# Patient Record
Sex: Male | Born: 1965 | Race: Black or African American | Hispanic: No | Marital: Married | Smoking: Never smoker
Health system: Southern US, Community
[De-identification: ages and names within clinical notes are randomized; demographics above are authoritative.]

## PROBLEM LIST (undated history)

## (undated) DIAGNOSIS — R7303 Prediabetes: Secondary | ICD-10-CM

## (undated) DIAGNOSIS — E785 Hyperlipidemia, unspecified: Secondary | ICD-10-CM

## (undated) DIAGNOSIS — I10 Essential (primary) hypertension: Secondary | ICD-10-CM

## (undated) DIAGNOSIS — G4730 Sleep apnea, unspecified: Secondary | ICD-10-CM

## (undated) DIAGNOSIS — J189 Pneumonia, unspecified organism: Secondary | ICD-10-CM

## (undated) DIAGNOSIS — J309 Allergic rhinitis, unspecified: Secondary | ICD-10-CM

## (undated) DIAGNOSIS — F419 Anxiety disorder, unspecified: Secondary | ICD-10-CM

## (undated) HISTORY — PX: SINOSCOPY: SHX187

## (undated) HISTORY — DX: Allergic rhinitis, unspecified: J30.9

---

## 2016-04-21 ENCOUNTER — Emergency Department (HOSPITAL_COMMUNITY): Payer: Federal, State, Local not specified - PPO

## 2016-04-21 ENCOUNTER — Other Ambulatory Visit: Payer: Self-pay

## 2016-04-21 ENCOUNTER — Emergency Department (HOSPITAL_COMMUNITY)
Admission: EM | Admit: 2016-04-21 | Discharge: 2016-04-21 | Disposition: A | Discharge status: Home / Self Care | Payer: Federal, State, Local not specified - PPO | Attending: Emergency Medicine | Admitting: Emergency Medicine

## 2016-04-21 ENCOUNTER — Encounter (HOSPITAL_COMMUNITY): Payer: Self-pay | Admitting: Nurse Practitioner

## 2016-04-21 DIAGNOSIS — F439 Reaction to severe stress, unspecified: Secondary | ICD-10-CM | POA: Diagnosis not present

## 2016-04-21 DIAGNOSIS — N289 Disorder of kidney and ureter, unspecified: Secondary | ICD-10-CM

## 2016-04-21 DIAGNOSIS — R791 Abnormal coagulation profile: Secondary | ICD-10-CM | POA: Diagnosis not present

## 2016-04-21 DIAGNOSIS — I10 Essential (primary) hypertension: Secondary | ICD-10-CM | POA: Diagnosis not present

## 2016-04-21 DIAGNOSIS — R42 Dizziness and giddiness: Secondary | ICD-10-CM

## 2016-04-21 DIAGNOSIS — F419 Anxiety disorder, unspecified: Secondary | ICD-10-CM | POA: Diagnosis not present

## 2016-04-21 HISTORY — DX: Essential (primary) hypertension: I10

## 2016-04-21 LAB — APTT: APTT: 26 s (ref 24–37)

## 2016-04-21 LAB — CBG MONITORING, ED: Glucose-Capillary: 172 mg/dL — ABNORMAL HIGH (ref 65–99)

## 2016-04-21 LAB — CBC
HEMATOCRIT: 49.9 % (ref 39.0–52.0)
Hemoglobin: 16.4 g/dL (ref 13.0–17.0)
MCH: 27.1 pg (ref 26.0–34.0)
MCHC: 32.9 g/dL (ref 30.0–36.0)
MCV: 82.5 fL (ref 78.0–100.0)
PLATELETS: 248 10*3/uL (ref 150–400)
RBC: 6.05 MIL/uL — ABNORMAL HIGH (ref 4.22–5.81)
RDW: 13.1 % (ref 11.5–15.5)
WBC: 9.3 10*3/uL (ref 4.0–10.5)

## 2016-04-21 LAB — I-STAT CHEM 8, ED
BUN: 14 mg/dL (ref 6–20)
CREATININE: 1.4 mg/dL — AB (ref 0.61–1.24)
Calcium, Ion: 1.14 mmol/L (ref 1.13–1.30)
Chloride: 102 mmol/L (ref 101–111)
Glucose, Bld: 149 mg/dL — ABNORMAL HIGH (ref 65–99)
HEMATOCRIT: 53 % — AB (ref 39.0–52.0)
HEMOGLOBIN: 18 g/dL — AB (ref 13.0–17.0)
POTASSIUM: 4 mmol/L (ref 3.5–5.1)
SODIUM: 139 mmol/L (ref 135–145)
TCO2: 25 mmol/L (ref 0–100)

## 2016-04-21 LAB — COMPREHENSIVE METABOLIC PANEL
ALT: 24 U/L (ref 17–63)
AST: 28 U/L (ref 15–41)
Albumin: 4.4 g/dL (ref 3.5–5.0)
Alkaline Phosphatase: 50 U/L (ref 38–126)
Anion gap: 10 (ref 5–15)
BUN: 11 mg/dL (ref 6–20)
CHLORIDE: 104 mmol/L (ref 101–111)
CO2: 22 mmol/L (ref 22–32)
CREATININE: 1.66 mg/dL — AB (ref 0.61–1.24)
Calcium: 9.6 mg/dL (ref 8.9–10.3)
GFR calc Af Amer: 55 mL/min — ABNORMAL LOW (ref >60)
GFR calc non Af Amer: 47 mL/min — ABNORMAL LOW (ref >60)
Glucose, Bld: 152 mg/dL — ABNORMAL HIGH (ref 65–99)
POTASSIUM: 3.9 mmol/L (ref 3.5–5.1)
SODIUM: 136 mmol/L (ref 135–145)
Total Bilirubin: 0.9 mg/dL (ref 0.3–1.2)
Total Protein: 7.8 g/dL (ref 6.5–8.1)

## 2016-04-21 LAB — DIFFERENTIAL
BASOS ABS: 0 10*3/uL (ref 0.0–0.1)
BASOS PCT: 0 %
EOS ABS: 0.1 10*3/uL (ref 0.0–0.7)
Eosinophils Relative: 1 %
Lymphocytes Relative: 20 %
Lymphs Abs: 1.8 10*3/uL (ref 0.7–4.0)
Monocytes Absolute: 0.3 10*3/uL (ref 0.1–1.0)
Monocytes Relative: 3 %
NEUTROS ABS: 7 10*3/uL (ref 1.7–7.7)
NEUTROS PCT: 76 %

## 2016-04-21 LAB — I-STAT TROPONIN, ED: TROPONIN I, POC: 0 ng/mL (ref 0.00–0.08)

## 2016-04-21 LAB — PROTIME-INR
INR: 1.14 (ref 0.00–1.49)
PROTHROMBIN TIME: 14.8 s (ref 11.6–15.2)

## 2016-04-21 MED ORDER — ZOLPIDEM TARTRATE 5 MG PO TABS: 5.0000 mg | ORAL_TABLET | Freq: Every evening | ORAL | Status: DC | PRN

## 2016-04-21 MED ORDER — SERTRALINE HCL 25 MG PO TABS: 25.0000 mg | ORAL_TABLET | Freq: Every day | ORAL | Status: DC

## 2016-04-21 NOTE — ED Notes (Signed)
He c/o onset dizziness and posterior head pressure while sitting at work today. He reports several of these "dizzy spells" recently which he has been evaluated for with no diagnosis. He reports nausea, blurred vision, and feeling weak with the dizzy spells. He reports symptoms have improved since onset today but does remain mildly dizzy especially with head movements. He is alert and breathing easily.

## 2016-04-21 NOTE — ED Notes (Signed)
Pt. Transported to CT at this time.  

## 2016-04-21 NOTE — ED Provider Notes (Signed)
CSN: [id number]     Arrival date & time 04/21/16  1113 History   First MD Initiated Contact with Patient 04/21/16 1129     Chief Complaint  Patient presents with  . Dizziness    HPI Patient presents to the emergency room for evaluation of an episode of dizziness and head pressure while at work today. Patient states he started having an episode of nausea, he felt like he was lightheaded and might pass out. He felt like it was difficult to concentrate and his speech may have been affected. He felt some tingling on the right side. He denies any focal weakness. He called his wife. She brought him to the hospital for evaluation. He drank a smoothie on the way over and feels a little bit better. Patient has been having some trouble with fatigue over the last few weeks. He feels like his exercise tolerance is decreased. Patient also feels like his penis is retracted. He denies any trouble with chest pain. He is not having any shortness of breath. He denies any leg swelling. No vomiting or diarrhea. No fever. Past Medical History  Diagnosis Date  . Hypertension    History reviewed. No pertinent past surgical history. History reviewed. No pertinent family history. Social History  Substance Use Topics  . Smoking status: Never Smoker   . Smokeless tobacco: None  . Alcohol Use: Yes    Review of Systems  All other systems reviewed and are negative.     Allergies  Review of patient's allergies indicates no known allergies.  Home Medications   Prior to Admission medications   Medication Sig Start Date End Date Taking? Authorizing Provider  amoxicillin-clavulanate (AUGMENTIN) 875-125 MG tablet Take 1 tablet by mouth 2 (two) times daily. 04/08/16  Yes Historical Provider, MD  fexofenadine-pseudoephedrine (ALLEGRA-D) 60-120 MG 12 hr tablet Take 1 tablet by mouth 2 (two) times daily as needed (allergies/congestion).   Yes Historical Provider, MD  Flaxseed, Linseed, (FLAX SEED OIL PO) Take 1 tablet  by mouth daily.   Yes Historical Provider, MD  Multiple Vitamins-Minerals (MULTIVITAMIN PO) Take 1 tablet by mouth daily.   Yes Historical Provider, MD  Omega-3 Fatty Acids (OMEGA 3 PO) Take 1 tablet by mouth daily.   Yes Historical Provider, MD  sertraline (ZOLOFT) 25 MG tablet Take 1 tablet (25 mg total) by mouth daily. 04/21/16   Linwood DibblesJon Lonita Debes, MD  zolpidem (AMBIEN) 5 MG tablet Take 1 tablet (5 mg total) by mouth at bedtime as needed for sleep. 04/21/16   Linwood DibblesJon Seth Higginbotham, MD   BP 119/77 mmHg  Pulse 86  Temp(Src) 98.3 F (36.8 C) (Oral)  Resp 15  Ht 6\' 1"  (1.854 m)  Wt 102.558 kg  BMI 29.84 kg/m2  SpO2 99% Physical Exam  Constitutional: He is oriented to person, place, and time. He appears well-developed and well-nourished. No distress.  HENT:  Head: Normocephalic and atraumatic.  Right Ear: External ear normal.  Left Ear: External ear normal.  Mouth/Throat: Oropharynx is clear and moist.  Eyes: Conjunctivae are normal. Right eye exhibits no discharge. Left eye exhibits no discharge. No scleral icterus.  Neck: Neck supple. No tracheal deviation present.  Cardiovascular: Normal rate, regular rhythm and intact distal pulses.   Pulmonary/Chest: Effort normal and breath sounds normal. No stridor. No respiratory distress. He has no wheezes. He has no rales.  Abdominal: Soft. Bowel sounds are normal. He exhibits no distension. There is no tenderness. There is no rebound and no guarding.  Musculoskeletal: He exhibits no  edema or tenderness.  Neurological: He is alert and oriented to person, place, and time. He has normal strength. No cranial nerve deficit (No facial droop, extraocular movements intact, tongue midline ) or sensory deficit. He exhibits normal muscle tone. He displays no seizure activity. Coordination normal.  No pronator drift bilateral upper extrem, able to hold both legs off bed for 5 seconds, sensation intact in all extremities, no visual field cuts, no left or right sided neglect,  normal finger-nose exam bilaterally, no nystagmus noted   Skin: Skin is warm and dry. No rash noted.  Psychiatric: He has a normal mood and affect.  Nursing note and vitals reviewed.   ED Course  Procedures (including critical care time) Labs Review Labs Reviewed  CBC - Abnormal; Notable for the following:    RBC 6.05 (*)    All other components within normal limits  COMPREHENSIVE METABOLIC PANEL - Abnormal; Notable for the following:    Glucose, Bld 152 (*)    Creatinine, Ser 1.66 (*)    GFR calc non Af Amer 47 (*)    GFR calc Af Amer 55 (*)    All other components within normal limits  CBG MONITORING, ED - Abnormal; Notable for the following:    Glucose-Capillary 172 (*)    All other components within normal limits  I-STAT CHEM 8, ED - Abnormal; Notable for the following:    Creatinine, Ser 1.40 (*)    Glucose, Bld 149 (*)    Hemoglobin 18.0 (*)    HCT 53.0 (*)    All other components within normal limits  PROTIME-INR  APTT  DIFFERENTIAL  I-STAT TROPOININ, ED  I-STAT TROPOININ, ED    Imaging Review Ct Head Wo Contrast  04/21/2016  CLINICAL DATA:  Progressive dizziness for several weeks. Hypertension. EXAM: CT HEAD WITHOUT CONTRAST TECHNIQUE: Contiguous axial images were obtained from the base of the skull through the vertex without intravenous contrast. COMPARISON:  None. FINDINGS: No evidence of intracranial hemorrhage, brain edema, or other signs of acute infarction. No evidence of intracranial mass lesion or mass effect. No abnormal extraaxial fluid collections identified. Ventricles are normal in size. No skull abnormality identified. IMPRESSION: Negative noncontrast head CT. Electronically Signed   By: Myles Rosenthal M.D.   On: 04/21/2016 12:19   I have personally reviewed and evaluated these images and lab results as part of my medical decision-making.   EKG Interpretation   Date/Time:  Tuesday April 21 2016 11:20:54 EDT Ventricular Rate:  103 PR Interval:  138 QRS  Duration: 80 QT Interval:  312 QTC Calculation: 408 R Axis:   116 Text Interpretation:  Sinus tachycardia Right axis deviation Pulmonary  disease pattern Abnormal ECG No old tracing to compare Confirmed by Mateusz Neilan   MD-J, Jaxton Casale ([id number]) on 04/21/2016 12:46:40 PM      MDM   Final diagnoses:  Dizziness  Stress  Renal insufficiency  Anxiety   The patient presented to the emergency room with various complaints. The included an episode of dizziness today associated with nausea. Patient also has noticed decreased exercise tolerance recently. Discussed the test results with the patient and his family. Doubt TIA, stroke, ACS based on sx.The patient and wife stated they think a lot of his symptoms may be related to stress and anxiety. Over the last year the patient's had several friends passed away. This  understandably has concerned him.  Patient has not been sleeping well for several months. He wakes up at night feeling stressed and anxious. Patient  is planning on seeing his primary doctor but the family requested medication for anxiety. I will start him on Zoloft and prescription for Ambien, 3 tablets.    Linwood DibblesJon Chanan Detwiler, MD 04/21/16 [phone]

## 2016-04-21 NOTE — ED Notes (Signed)
CBG 172 

## 2016-04-21 NOTE — ED Notes (Signed)
EDP at bedside  

## 2016-06-30 DIAGNOSIS — G4700 Insomnia, unspecified: Secondary | ICD-10-CM | POA: Diagnosis not present

## 2016-07-17 DIAGNOSIS — M25512 Pain in left shoulder: Secondary | ICD-10-CM | POA: Diagnosis not present

## 2016-07-17 DIAGNOSIS — F419 Anxiety disorder, unspecified: Secondary | ICD-10-CM | POA: Diagnosis not present

## 2016-07-26 HISTORY — PX: SEPTOPLASTY: SUR1290

## 2016-08-13 DIAGNOSIS — J322 Chronic ethmoidal sinusitis: Secondary | ICD-10-CM | POA: Diagnosis not present

## 2016-08-13 DIAGNOSIS — J342 Deviated nasal septum: Secondary | ICD-10-CM | POA: Diagnosis not present

## 2016-08-13 DIAGNOSIS — J320 Chronic maxillary sinusitis: Secondary | ICD-10-CM | POA: Diagnosis not present

## 2016-08-13 DIAGNOSIS — J328 Other chronic sinusitis: Secondary | ICD-10-CM | POA: Diagnosis not present

## 2016-08-13 DIAGNOSIS — J3489 Other specified disorders of nose and nasal sinuses: Secondary | ICD-10-CM | POA: Diagnosis not present

## 2016-08-13 DIAGNOSIS — J343 Hypertrophy of nasal turbinates: Secondary | ICD-10-CM | POA: Diagnosis not present

## 2016-08-13 DIAGNOSIS — J329 Chronic sinusitis, unspecified: Secondary | ICD-10-CM | POA: Diagnosis not present

## 2016-08-20 DIAGNOSIS — J322 Chronic ethmoidal sinusitis: Secondary | ICD-10-CM | POA: Diagnosis not present

## 2016-08-20 DIAGNOSIS — J320 Chronic maxillary sinusitis: Secondary | ICD-10-CM | POA: Diagnosis not present

## 2016-08-27 DIAGNOSIS — J322 Chronic ethmoidal sinusitis: Secondary | ICD-10-CM | POA: Diagnosis not present

## 2016-08-27 DIAGNOSIS — J320 Chronic maxillary sinusitis: Secondary | ICD-10-CM | POA: Diagnosis not present

## 2016-10-15 DIAGNOSIS — K080 Exfoliation of teeth due to systemic causes: Secondary | ICD-10-CM | POA: Diagnosis not present

## 2016-10-16 DIAGNOSIS — K080 Exfoliation of teeth due to systemic causes: Secondary | ICD-10-CM | POA: Diagnosis not present

## 2016-10-28 DIAGNOSIS — K080 Exfoliation of teeth due to systemic causes: Secondary | ICD-10-CM | POA: Diagnosis not present

## 2016-11-01 DIAGNOSIS — J019 Acute sinusitis, unspecified: Secondary | ICD-10-CM | POA: Diagnosis not present

## 2016-11-24 DIAGNOSIS — J320 Chronic maxillary sinusitis: Secondary | ICD-10-CM | POA: Diagnosis not present

## 2016-11-24 DIAGNOSIS — J322 Chronic ethmoidal sinusitis: Secondary | ICD-10-CM | POA: Diagnosis not present

## 2016-12-09 DIAGNOSIS — J069 Acute upper respiratory infection, unspecified: Secondary | ICD-10-CM | POA: Diagnosis not present

## 2016-12-09 DIAGNOSIS — J029 Acute pharyngitis, unspecified: Secondary | ICD-10-CM | POA: Diagnosis not present

## 2017-01-17 DIAGNOSIS — Z125 Encounter for screening for malignant neoplasm of prostate: Secondary | ICD-10-CM | POA: Diagnosis not present

## 2017-01-17 DIAGNOSIS — Z0000 Encounter for general adult medical examination without abnormal findings: Secondary | ICD-10-CM | POA: Diagnosis not present

## 2017-03-18 DIAGNOSIS — G4700 Insomnia, unspecified: Secondary | ICD-10-CM | POA: Diagnosis not present

## 2017-03-18 DIAGNOSIS — G4719 Other hypersomnia: Secondary | ICD-10-CM | POA: Diagnosis not present

## 2017-03-29 DIAGNOSIS — G4733 Obstructive sleep apnea (adult) (pediatric): Secondary | ICD-10-CM | POA: Diagnosis not present

## 2017-04-03 DIAGNOSIS — G4733 Obstructive sleep apnea (adult) (pediatric): Secondary | ICD-10-CM | POA: Diagnosis not present

## 2017-05-03 DIAGNOSIS — G4733 Obstructive sleep apnea (adult) (pediatric): Secondary | ICD-10-CM | POA: Diagnosis not present

## 2017-05-26 DIAGNOSIS — F419 Anxiety disorder, unspecified: Secondary | ICD-10-CM | POA: Diagnosis not present

## 2017-06-03 DIAGNOSIS — G4733 Obstructive sleep apnea (adult) (pediatric): Secondary | ICD-10-CM | POA: Diagnosis not present

## 2017-07-02 DIAGNOSIS — G4733 Obstructive sleep apnea (adult) (pediatric): Secondary | ICD-10-CM | POA: Diagnosis not present

## 2017-07-04 DIAGNOSIS — G4733 Obstructive sleep apnea (adult) (pediatric): Secondary | ICD-10-CM | POA: Diagnosis not present

## 2017-07-07 DIAGNOSIS — B349 Viral infection, unspecified: Secondary | ICD-10-CM | POA: Diagnosis not present

## 2017-07-08 DIAGNOSIS — J0100 Acute maxillary sinusitis, unspecified: Secondary | ICD-10-CM | POA: Diagnosis not present

## 2017-08-16 DIAGNOSIS — G4733 Obstructive sleep apnea (adult) (pediatric): Secondary | ICD-10-CM | POA: Diagnosis not present

## 2017-08-19 DIAGNOSIS — K080 Exfoliation of teeth due to systemic causes: Secondary | ICD-10-CM | POA: Diagnosis not present

## 2017-08-20 DIAGNOSIS — K080 Exfoliation of teeth due to systemic causes: Secondary | ICD-10-CM | POA: Diagnosis not present

## 2017-08-23 DIAGNOSIS — J0100 Acute maxillary sinusitis, unspecified: Secondary | ICD-10-CM | POA: Diagnosis not present

## 2017-08-24 DIAGNOSIS — K080 Exfoliation of teeth due to systemic causes: Secondary | ICD-10-CM | POA: Diagnosis not present

## 2017-09-01 DIAGNOSIS — Z0000 Encounter for general adult medical examination without abnormal findings: Secondary | ICD-10-CM | POA: Diagnosis not present

## 2017-09-01 DIAGNOSIS — Z23 Encounter for immunization: Secondary | ICD-10-CM | POA: Diagnosis not present

## 2017-09-02 DIAGNOSIS — Z13220 Encounter for screening for lipoid disorders: Secondary | ICD-10-CM | POA: Diagnosis not present

## 2017-09-02 DIAGNOSIS — Z0000 Encounter for general adult medical examination without abnormal findings: Secondary | ICD-10-CM | POA: Diagnosis not present

## 2017-09-02 DIAGNOSIS — Z131 Encounter for screening for diabetes mellitus: Secondary | ICD-10-CM | POA: Diagnosis not present

## 2017-11-03 DIAGNOSIS — H6121 Impacted cerumen, right ear: Secondary | ICD-10-CM | POA: Diagnosis not present

## 2017-11-03 DIAGNOSIS — J3081 Allergic rhinitis due to animal (cat) (dog) hair and dander: Secondary | ICD-10-CM | POA: Diagnosis not present

## 2017-11-03 DIAGNOSIS — J301 Allergic rhinitis due to pollen: Secondary | ICD-10-CM | POA: Diagnosis not present

## 2017-12-07 ENCOUNTER — Ambulatory Visit: Payer: Federal, State, Local not specified - PPO | Admitting: Allergy and Immunology

## 2017-12-07 ENCOUNTER — Encounter: Payer: Self-pay | Admitting: Allergy and Immunology

## 2017-12-07 VITALS — BP 120/76 | HR 74 | Temp 97.9°F | Resp 16 | Ht 74.0 in | Wt 255.8 lb

## 2017-12-07 DIAGNOSIS — R519 Headache, unspecified: Secondary | ICD-10-CM

## 2017-12-07 DIAGNOSIS — R51 Headache: Secondary | ICD-10-CM | POA: Diagnosis not present

## 2017-12-07 DIAGNOSIS — H1010 Acute atopic conjunctivitis, unspecified eye: Secondary | ICD-10-CM

## 2017-12-07 DIAGNOSIS — J3089 Other allergic rhinitis: Secondary | ICD-10-CM

## 2017-12-07 MED ORDER — OLOPATADINE HCL 0.7 % OP SOLN: 1.0000 [drp] | OPHTHALMIC | 5 refills | Status: DC

## 2017-12-07 MED ORDER — AZELASTINE-FLUTICASONE 137-50 MCG/ACT NA SUSP: 1.0000 | Freq: Two times a day (BID) | NASAL | 5 refills | Status: DC

## 2017-12-07 MED ORDER — MONTELUKAST SODIUM 10 MG PO TABS: 10.0000 mg | ORAL_TABLET | Freq: Every day | ORAL | 5 refills | Status: DC

## 2017-12-07 NOTE — Progress Notes (Signed)
NEW PATIENT NOTE  Referring Provider: No ref. provider found Primary Provider: Sigmund Hazel, MD Date of office visit: 12/07/2017    Subjective:   Chief Complaint:  Cody Wade (DOB: 26-Mar-1966) is a 52 y.o. male who presents to the clinic on 12/07/2017 with a chief complaint of Allergies; Sinus Problem; and Eye Problem .  HPI: Cody Wade presents to this clinic in evaluation of allergies.  He has a long history starting in his 72s of nasal congestion and stuffiness and runny nose and sneeze and runny eyes occurring on a perennial basis without an obvious trigger.  He has been worse in the past 2-1/2 years since moving from [place] to [place].  He has apparently been skin tested twice and found to be allergic to just dust mite.  His last skin testing was 7 years ago in [place].  He also has headaches located in the back of his head that oscillate in and out and are not associated with any scotoma or neurological symptoms.  Usually he can function without much problem with these headaches but occasionally he will try to lay down for relief.  They occur with a frequency of about 1 time per week.  He does drink caffeine a few times per week and rarely has chocolate and has ethanol on the weekend.  He has sleep apnea treated successfully with a CPAP machine.  Congestion at night while using a CPAP machine is not a particularly big issue.  He does not have any associated anosmia or decreased ability to taste or ugly nasal discharge or the need to use antibiotics for recurrent infections.  He does have some intermittent earaches affecting the right side more than the left side but no hearing loss or tinnitus or vertigo.  He has tried a multitude of different medications to treat this issue including a nasal steroid and antihistamines and Sudafed.  Past Medical History:  Diagnosis Date  . Hypertension     Past Surgical History:  Procedure Laterality Date  . SEPTOPLASTY Bilateral  07/2016  . SINOSCOPY      Allergies as of 12/07/2017   No Known Allergies     Medication List      fexofenadine-pseudoephedrine 60-120 MG 12 hr tablet Commonly known as:  ALLEGRA-D Take 1 tablet by mouth 2 (two) times daily as needed (allergies/congestion).   FLAX SEED OIL PO Take 1 tablet by mouth daily.   mometasone 50 MCG/ACT nasal spray Commonly known as:  NASONEX Place 1 spray into the nose daily.   MULTIVITAMIN PO Take 1 tablet by mouth daily.   OMEGA 3 PO Take 1 tablet by mouth daily.   sertraline 25 MG tablet Commonly known as:  ZOLOFT Take 1 tablet (25 mg total) by mouth daily.       Review of systems negative except as noted in HPI / PMHx or noted below:  Review of Systems  Constitutional: Negative.   HENT: Negative.   Eyes: Negative.   Respiratory: Negative.   Cardiovascular: Negative.   Gastrointestinal: Negative.   Genitourinary: Negative.   Musculoskeletal: Negative.   Skin: Negative.   Neurological: Negative.   Endo/Heme/Allergies: Negative.   Psychiatric/Behavioral: Negative.     Family History  Problem Relation Age of Onset  . Hypertension Mother   . Diabetes Father   . Cancer Father   . Cancer Maternal Grandmother     Social History   Socioeconomic History  . Marital status: Married    Spouse name: Not  on file  . Number of children: Not on file  . Years of education: Not on file  . Highest education level: Not on file  Social Needs  . Financial resource strain: Not on file  . Food insecurity - worry: Not on file  . Food insecurity - inability: Not on file  . Transportation needs - medical: Not on file  . Transportation needs - non-medical: Not on file  Occupational History  . Not on file  Tobacco Use  . Smoking status: Never Smoker  . Smokeless tobacco: Never Used  Substance and Sexual Activity  . Alcohol use: Yes    Comment: occassional  . Drug use: No  . Sexual activity: Not on file  Other Topics Concern  . Not on  file  Social History Narrative  . Not on file    Environmental and Social history  Lives in a house with a dry environment, no animals located inside the household, carpet in the bedroom, no plastic on the bed, no plastic on the pillow, and no smoking ongoing with inside the household.  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  HENT:  Head: Normocephalic.  Right Ear: Tympanic membrane, external ear and ear canal normal.  Left Ear: Tympanic membrane, external ear and ear canal normal.  Nose: Mucosal edema present. No rhinorrhea.  Mouth/Throat: Uvula is midline, oropharynx is clear and moist and mucous membranes are normal. No oropharyngeal exudate.  Eyes: Conjunctivae are normal.  Neck: Trachea normal. No tracheal tenderness present. No tracheal deviation present. No thyromegaly present.  Cardiovascular: Normal rate, regular rhythm, S1 normal, S2 normal and normal heart sounds.  No murmur heard. Pulmonary/Chest: Breath sounds normal. No stridor. No respiratory distress. He has no wheezes. He has no rales.  Musculoskeletal: He exhibits no edema.  Lymphadenopathy:       Head (right side): No tonsillar adenopathy present.       Head (left side): No tonsillar adenopathy present.    He has no cervical adenopathy.  Neurological: He is alert. Gait normal.  Skin: No rash noted. He is not diaphoretic. No erythema. Nails show no clubbing.  Psychiatric: Mood and affect normal.    Diagnostics: Allergy skin tests were performed.  He did not demonstrate any hypersensitivity against a screening panel of aeroallergens or foods.  Assessment and Plan:    1. Other allergic rhinitis   2. Seasonal allergic conjunctivitis   3. Headache  disorder     1.  Allergen avoidance measures: Check area 2 aero allergen profile  2.  Treat and prevent inflammation:   A. Dymista -1 spray each nostril twice a day  B. Montelukast 10mg  -1 tablet one time per day  3. Eliminate all sources of caffeine to help prevent headaches  4. If needed:   A. Nasal saline  B. OTC antihistamine  C. Pazeo - 1 drop each eye one time per day  5. Immunotherapy?  6. Return to clinic in 4 weeks or earlier if problem  I will have Cody Wade utilize anti-inflammatory agents for his respiratory tract as noted above and further investigate his atopic disease with a area 2 aero allergen profile possibly in an attempt to  provide him immunotherapy directed against his atopic disease.  As well, he has headaches and the first technique we will used to address this issue is to have him eliminate all sources of caffeine consumption.  If he still remains with headaches he will obviously require further evaluation.  I will regroup with him in 4 weeks.  Laurette Schimke, MD Allergy / Immunology [facility]

## 2017-12-07 NOTE — Patient Instructions (Addendum)
  1.  Allergen avoidance measures: Check area 2 aero allergen profile  2.  Treat and prevent inflammation:   A. Dymista -1 spray each nostril twice a day  B. Montelukast 10mg  -1 tablet one time per day  3. Eliminate all sources of caffeine to help prevent headaches  4. If needed:   A. Nasal saline  B. OTC antihistamine  C. Pazeo - 1 drop each eye one time per day  5. Immunotherapy?  6. Return to clinic in 4 weeks or earlier if problem

## 2017-12-08 ENCOUNTER — Encounter: Payer: Self-pay | Admitting: Allergy and Immunology

## 2017-12-09 LAB — ALLERGENS W/TOTAL IGE AREA 2
Alternaria Alternata IgE: <0.1 kU/L
Aspergillus Fumigatus IgE: <0.1 kU/L
Bermuda Grass IgE: <0.1 kU/L
Cat Dander IgE: <0.1 kU/L
Cladosporium Herbarum IgE: <0.1 kU/L
Cockroach, German IgE: <0.1 kU/L
Cottonwood IgE: <0.1 kU/L
D Farinae IgE: 0.13 kU/L — AB
Elm, American IgE: <0.1 kU/L
IGE (IMMUNOGLOBULIN E), SERUM: 28 [IU]/mL (ref 0–100)
Johnson Grass IgE: <0.1 kU/L
Maple/Box Elder IgE: <0.1 kU/L
Mouse Urine IgE: <0.1 kU/L
Pecan, Hickory IgE: <0.1 kU/L
Penicillium Chrysogen IgE: <0.1 kU/L
Pigweed, Rough IgE: <0.1 kU/L
Sheep Sorrel IgE Qn: <0.1 kU/L
Timothy Grass IgE: <0.1 kU/L
White Mulberry IgE: <0.1 kU/L

## 2017-12-16 ENCOUNTER — Telehealth: Payer: Self-pay

## 2017-12-16 MED ORDER — FLUTICASONE PROPIONATE 50 MCG/ACT NA SUSP: 1.0000 | Freq: Two times a day (BID) | NASAL | 5 refills | Status: DC

## 2017-12-16 MED ORDER — AZELASTINE HCL 0.1 % NA SOLN: 1.0000 | Freq: Two times a day (BID) | NASAL | 5 refills | Status: DC

## 2017-12-16 NOTE — Telephone Encounter (Signed)
Patient's insurance has denied coverage of Dymista. The alternatives are flunisolide or fluticasone. Please advise and thank you.

## 2017-12-16 NOTE — Addendum Note (Signed)
Addended by: Bennye AlmMIRANDA, Namrata Dangler on: 12/16/2017 09:14 AM   Modules accepted: Orders

## 2017-12-16 NOTE — Telephone Encounter (Signed)
Prescriptions sent in. Left message for patient in regards to medication change.

## 2017-12-16 NOTE — Telephone Encounter (Signed)
Can use combination of fluticasone and azelastine one spray each nostril two times per day

## 2017-12-20 NOTE — Telephone Encounter (Signed)
Left message to return call 

## 2017-12-20 NOTE — Telephone Encounter (Signed)
Patient called and I informed him of prescriptions that were sent in. He will call back if he has any problems.

## 2017-12-23 ENCOUNTER — Other Ambulatory Visit: Payer: Self-pay | Admitting: Allergy and Immunology

## 2017-12-23 DIAGNOSIS — J3089 Other allergic rhinitis: Secondary | ICD-10-CM

## 2017-12-23 NOTE — Progress Notes (Signed)
VIALS EXP 12-23-18 

## 2017-12-24 DIAGNOSIS — J3089 Other allergic rhinitis: Secondary | ICD-10-CM | POA: Diagnosis not present

## 2018-01-03 DIAGNOSIS — M25511 Pain in right shoulder: Secondary | ICD-10-CM | POA: Diagnosis not present

## 2018-01-04 ENCOUNTER — Ambulatory Visit: Payer: Self-pay | Admitting: *Deleted

## 2018-01-04 ENCOUNTER — Encounter: Payer: Self-pay | Admitting: Allergy and Immunology

## 2018-01-04 ENCOUNTER — Ambulatory Visit: Payer: Federal, State, Local not specified - PPO | Admitting: Allergy and Immunology

## 2018-01-04 VITALS — BP 138/78 | HR 92 | Resp 18

## 2018-01-04 DIAGNOSIS — J3089 Other allergic rhinitis: Secondary | ICD-10-CM | POA: Diagnosis not present

## 2018-01-04 DIAGNOSIS — H1010 Acute atopic conjunctivitis, unspecified eye: Secondary | ICD-10-CM

## 2018-01-04 DIAGNOSIS — R51 Headache: Secondary | ICD-10-CM | POA: Diagnosis not present

## 2018-01-04 DIAGNOSIS — R519 Headache, unspecified: Secondary | ICD-10-CM

## 2018-01-04 MED ORDER — EPINEPHRINE 0.3 MG/0.3ML IJ SOSY: PREFILLED_SYRINGE | INTRAMUSCULAR | 3 refills | Status: DC

## 2018-01-04 NOTE — Progress Notes (Signed)
Immunotherapy   Patient Details  Name: Tia AlertMatthew Wollard MRN: [id number] Date of Birth: [birth year]  01/04/2018  Tia AlertMatthew Gamino started injections for  dmite Following schedule: B  Frequency:2 times per week Epi-Pen:Prescription for Epi-Pen given Consent signed and patient instructions given.   Bennye AlmMildred Julisa Flippo 01/04/2018, 6:42 PM

## 2018-01-04 NOTE — Patient Instructions (Addendum)
  1.  Allergen avoidance measures directed against dust mite and Continue off all sources of caffeine to help prevent headaches  2.  Continue to treat and prevent inflammation:   A.  Flonase - 1 spray each nostril twice a day  B. Azelastine - 1 spray each nostril twice a day  C. Montelukast  -1 tablet one time per day  3. If needed:   A. Nasal saline  B. OTC antihistamine  C. Pazeo - 1 drop each eye one time per day  4.  Continue immunotherapy and EpiPen  5. Return to clinic in summer 2019 or earlier if problem

## 2018-01-04 NOTE — Progress Notes (Signed)
Follow-up Note  Referring Provider: Sigmund Hazel, MD Primary Provider: Sigmund Hazel, MD Date of Office Visit: 01/04/2018  Subjective:   Cody Wade (DOB: May 30, 1966) is a 52 y.o. male [facility] in re-evaluation of the following:  HPI: Cody Frizzle returns to this clinic in evaluation of his allergic rhinoconjunctivitis and headache syndrome.  He was last seen in this clinic during his initial evaluation of 07 December 2017.  He is doing better regarding his nose.  He is now using a combination of a nasal steroid and a nasal antihistamine.  He has starting immunotherapy today.  To date he has not performed allergen avoidance measures.  His headaches are much better.  He is not drinking any caffeine or eating any chocolate.  Allergies as of 01/04/2018   No Known Allergies     Medication List      ALLEGRA ALLERGY 180 MG tablet Generic drug:  fexofenadine Take 180 mg by mouth daily as needed for allergies or rhinitis.   azelastine 0.1 % nasal spray Commonly known as:  ASTELIN Place 1 spray into both nostrils 2 (two) times daily. Use in each nostril as directed   FLAX SEED OIL PO Take 1 tablet by mouth daily.   fluticasone 50 MCG/ACT nasal spray Commonly known as:  FLONASE Place 1 spray into both nostrils 2 (two) times daily.   montelukast 10 MG tablet Commonly known as:  SINGULAIR Take 1 tablet (10 mg total) by mouth at bedtime.   MULTIVITAMIN PO Take 1 tablet by mouth daily.   Olopatadine HCl 0.7 % Soln Commonly known as:  PAZEO Place 1 drop into both eyes 1 day or 1 dose.   OMEGA 3 PO Take 1 tablet by mouth daily.   sertraline 25 MG tablet Commonly known as:  ZOLOFT Take 1 tablet (25 mg total) by mouth daily.       Past Medical History:  Diagnosis Date  . Allergic rhinitis   . Hypertension     Past Surgical History:  Procedure Laterality Date  . SEPTOPLASTY Bilateral 07/2016  . SINOSCOPY      Review  of systems negative except as noted in HPI / PMHx or noted below:  Review of Systems  Constitutional: Negative.   HENT: Negative.   Eyes: Negative.   Respiratory: Negative.   Cardiovascular: Negative.   Gastrointestinal: Negative.   Genitourinary: Negative.   Musculoskeletal: Negative.   Skin: Negative.   Neurological: Negative.   Endo/Heme/Allergies: Negative.   Psychiatric/Behavioral: Negative.      Objective:   Vitals:   01/04/18 1617  BP: 138/78  Pulse: 92  Resp: 18          Physical Exam  Constitutional: He is well-developed, well-nourished, and in no distress.  HENT:  Head: Normocephalic.  Right Ear: Tympanic membrane, external ear and ear canal normal.  Left Ear: Tympanic membrane, external ear and ear canal normal.  Nose: Nose normal. No mucosal edema or rhinorrhea.  Mouth/Throat: Uvula is midline, oropharynx is clear and moist and mucous membranes are normal. No oropharyngeal exudate.  Eyes: Conjunctivae are normal.  Neck: Trachea normal. No tracheal tenderness present. No tracheal deviation present. No thyromegaly present.  Cardiovascular: Normal rate, regular rhythm, S1 normal, S2 normal and normal heart sounds.  No murmur heard. Pulmonary/Chest: Breath sounds normal. No stridor. No respiratory distress. He has no wheezes. He has no rales.  Musculoskeletal: He exhibits no edema.  Lymphadenopathy:  Head (right side): No tonsillar adenopathy present.       Head (left side): No tonsillar adenopathy present.    He has no cervical adenopathy.  Neurological: He is alert. Gait normal.  Skin: No rash noted. He is not diaphoretic. No erythema. Nails show no clubbing.  Psychiatric: Mood and affect normal.    Diagnostics:   Results of blood tests obtained 07 December 2017 identified hypersensitivity against dust mite.  Assessment and Plan:   1. Other allergic rhinitis   2. Seasonal allergic conjunctivitis   3. Headache disorder     1.  Allergen  avoidance measures directed against dust mite and Continue off all sources of caffeine to help prevent headaches  2.  Continue to treat and prevent inflammation:   A.  Flonase - 1 spray each nostril twice a day  B. Azelastine - 1 spray each nostril twice a day  C. Montelukast 10mg  -1 tablet one time per day  3. If needed:   A. Nasal saline  B. OTC antihistamine  C. Pazeo - 1 drop each eye one time per day  4.  Continue immunotherapy and EpiPen  5. Return to clinic in summer 2019 or earlier if problem  Cody Wade will perform allergen avoidance measures against dust mite and continue to remain away from all forms of caffeine while he uses anti-inflammatory agents for his airway and continues on immunotherapy.  I will see him back in this clinic in the summer 2019 or earlier if there is a problem.  Laurette SchimkeEric Kozlow, MD Allergy / Immunology [facility]

## 2018-01-05 ENCOUNTER — Encounter: Payer: Self-pay | Admitting: Allergy and Immunology

## 2018-01-06 ENCOUNTER — Ambulatory Visit (INDEPENDENT_AMBULATORY_CARE_PROVIDER_SITE_OTHER): Payer: Federal, State, Local not specified - PPO | Admitting: *Deleted

## 2018-01-06 DIAGNOSIS — J309 Allergic rhinitis, unspecified: Secondary | ICD-10-CM

## 2018-01-06 DIAGNOSIS — G4733 Obstructive sleep apnea (adult) (pediatric): Secondary | ICD-10-CM | POA: Diagnosis not present

## 2018-01-07 ENCOUNTER — Other Ambulatory Visit: Payer: Self-pay | Admitting: Family Medicine

## 2018-01-07 DIAGNOSIS — R918 Other nonspecific abnormal finding of lung field: Secondary | ICD-10-CM

## 2018-01-10 ENCOUNTER — Ambulatory Visit
Admission: RE | Admit: 2018-01-10 | Discharge: 2018-01-10 | Disposition: A | Discharge status: Home / Self Care | Payer: Federal, State, Local not specified - PPO | Source: Ambulatory Visit | Attending: Family Medicine | Admitting: Family Medicine

## 2018-01-10 DIAGNOSIS — R918 Other nonspecific abnormal finding of lung field: Secondary | ICD-10-CM

## 2018-01-10 DIAGNOSIS — J8410 Pulmonary fibrosis, unspecified: Secondary | ICD-10-CM | POA: Diagnosis not present

## 2018-01-10 MED ORDER — IOPAMIDOL (ISOVUE-300) INJECTION 61%
75.0000 mL | Freq: Once | INTRAVENOUS | Status: AC | PRN
  Administered 2018-01-10: 75 mL via INTRAVENOUS

## 2018-01-11 ENCOUNTER — Ambulatory Visit (INDEPENDENT_AMBULATORY_CARE_PROVIDER_SITE_OTHER): Payer: Federal, State, Local not specified - PPO | Admitting: *Deleted

## 2018-01-11 DIAGNOSIS — J309 Allergic rhinitis, unspecified: Secondary | ICD-10-CM | POA: Diagnosis not present

## 2018-01-13 ENCOUNTER — Ambulatory Visit (INDEPENDENT_AMBULATORY_CARE_PROVIDER_SITE_OTHER): Payer: Federal, State, Local not specified - PPO

## 2018-01-13 DIAGNOSIS — J309 Allergic rhinitis, unspecified: Secondary | ICD-10-CM

## 2018-01-26 ENCOUNTER — Ambulatory Visit (INDEPENDENT_AMBULATORY_CARE_PROVIDER_SITE_OTHER): Payer: Federal, State, Local not specified - PPO | Admitting: *Deleted

## 2018-01-26 DIAGNOSIS — J309 Allergic rhinitis, unspecified: Secondary | ICD-10-CM

## 2018-02-01 ENCOUNTER — Ambulatory Visit (INDEPENDENT_AMBULATORY_CARE_PROVIDER_SITE_OTHER): Payer: Federal, State, Local not specified - PPO | Admitting: *Deleted

## 2018-02-01 DIAGNOSIS — J309 Allergic rhinitis, unspecified: Secondary | ICD-10-CM

## 2018-02-03 ENCOUNTER — Ambulatory Visit (INDEPENDENT_AMBULATORY_CARE_PROVIDER_SITE_OTHER): Payer: Federal, State, Local not specified - PPO | Admitting: *Deleted

## 2018-02-03 DIAGNOSIS — J309 Allergic rhinitis, unspecified: Secondary | ICD-10-CM

## 2018-02-08 ENCOUNTER — Ambulatory Visit (INDEPENDENT_AMBULATORY_CARE_PROVIDER_SITE_OTHER): Payer: Federal, State, Local not specified - PPO | Admitting: *Deleted

## 2018-02-08 DIAGNOSIS — J309 Allergic rhinitis, unspecified: Secondary | ICD-10-CM

## 2018-02-10 ENCOUNTER — Ambulatory Visit (INDEPENDENT_AMBULATORY_CARE_PROVIDER_SITE_OTHER): Payer: Federal, State, Local not specified - PPO | Admitting: *Deleted

## 2018-02-10 DIAGNOSIS — J309 Allergic rhinitis, unspecified: Secondary | ICD-10-CM

## 2018-02-15 ENCOUNTER — Ambulatory Visit (INDEPENDENT_AMBULATORY_CARE_PROVIDER_SITE_OTHER): Payer: Federal, State, Local not specified - PPO | Admitting: *Deleted

## 2018-02-15 DIAGNOSIS — J309 Allergic rhinitis, unspecified: Secondary | ICD-10-CM

## 2018-02-22 ENCOUNTER — Ambulatory Visit (INDEPENDENT_AMBULATORY_CARE_PROVIDER_SITE_OTHER): Payer: Federal, State, Local not specified - PPO | Admitting: *Deleted

## 2018-02-22 DIAGNOSIS — J309 Allergic rhinitis, unspecified: Secondary | ICD-10-CM | POA: Diagnosis not present

## 2018-02-24 ENCOUNTER — Ambulatory Visit (INDEPENDENT_AMBULATORY_CARE_PROVIDER_SITE_OTHER): Payer: Federal, State, Local not specified - PPO | Admitting: *Deleted

## 2018-02-24 DIAGNOSIS — J309 Allergic rhinitis, unspecified: Secondary | ICD-10-CM

## 2018-03-08 ENCOUNTER — Ambulatory Visit (INDEPENDENT_AMBULATORY_CARE_PROVIDER_SITE_OTHER): Payer: Federal, State, Local not specified - PPO | Admitting: *Deleted

## 2018-03-08 DIAGNOSIS — J309 Allergic rhinitis, unspecified: Secondary | ICD-10-CM | POA: Diagnosis not present

## 2018-03-10 ENCOUNTER — Ambulatory Visit: Payer: Self-pay

## 2018-03-15 ENCOUNTER — Ambulatory Visit (INDEPENDENT_AMBULATORY_CARE_PROVIDER_SITE_OTHER): Payer: Federal, State, Local not specified - PPO | Admitting: *Deleted

## 2018-03-15 DIAGNOSIS — J309 Allergic rhinitis, unspecified: Secondary | ICD-10-CM

## 2018-03-17 ENCOUNTER — Ambulatory Visit (INDEPENDENT_AMBULATORY_CARE_PROVIDER_SITE_OTHER): Payer: Federal, State, Local not specified - PPO | Admitting: *Deleted

## 2018-03-17 DIAGNOSIS — J309 Allergic rhinitis, unspecified: Secondary | ICD-10-CM

## 2018-03-23 ENCOUNTER — Ambulatory Visit (INDEPENDENT_AMBULATORY_CARE_PROVIDER_SITE_OTHER): Payer: Federal, State, Local not specified - PPO

## 2018-03-23 DIAGNOSIS — J309 Allergic rhinitis, unspecified: Secondary | ICD-10-CM | POA: Diagnosis not present

## 2018-03-25 ENCOUNTER — Ambulatory Visit (INDEPENDENT_AMBULATORY_CARE_PROVIDER_SITE_OTHER): Payer: Federal, State, Local not specified - PPO

## 2018-03-25 DIAGNOSIS — J309 Allergic rhinitis, unspecified: Secondary | ICD-10-CM

## 2018-03-26 IMAGING — CT CT CHEST W/ CM
1 series · 16 of 34 positions shown, 20 images · IV contrast (APPLIED)
Comparison: RIGHT shoulder radiographs 01/03/2018

CLINICAL DATA: Abnormal RIGHT shoulder radiographs with
granulomatous disease of the chest

EXAM:
CT CHEST WITH CONTRAST
TECHNIQUE: Multidetector CT imaging of the chest was performed during
intravenous contrast administration. Sagittal and coronal MPR images
reconstructed from axial data set.
CONTRAST:  75 cc Psovue-ABB IV

[Series 2: chest w/cm · axial · 0.84mm/px · z∈[-104,+206]mm · 16 of 175 slices shown, 20 images]
[im 13/175  mediastinal]
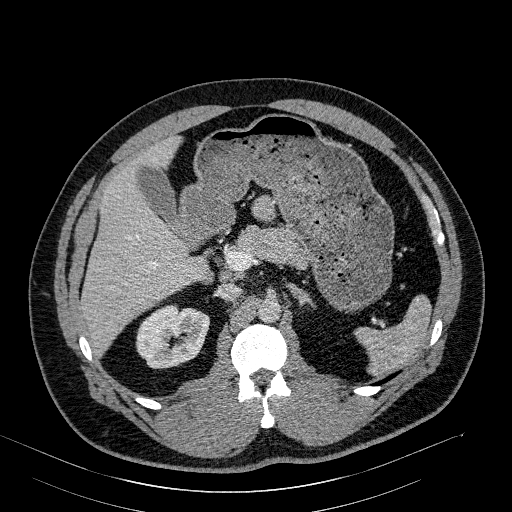
[im 13/175  lung]
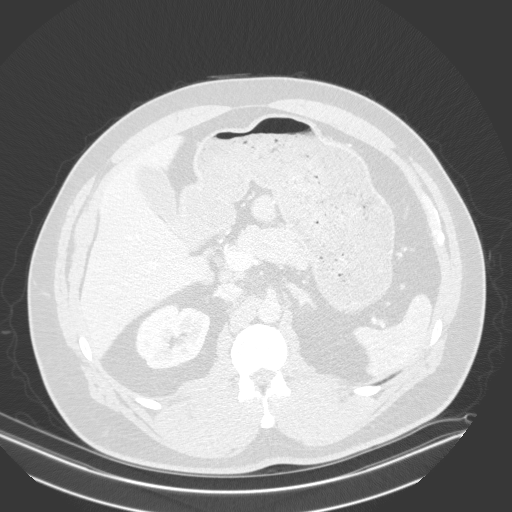
[im 26/175  lung]
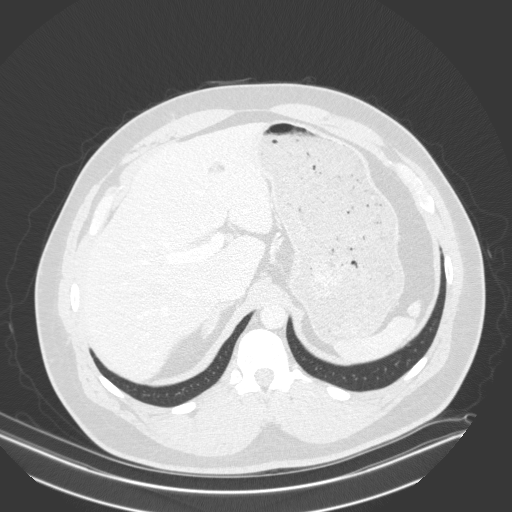
[im 35/175  lung]
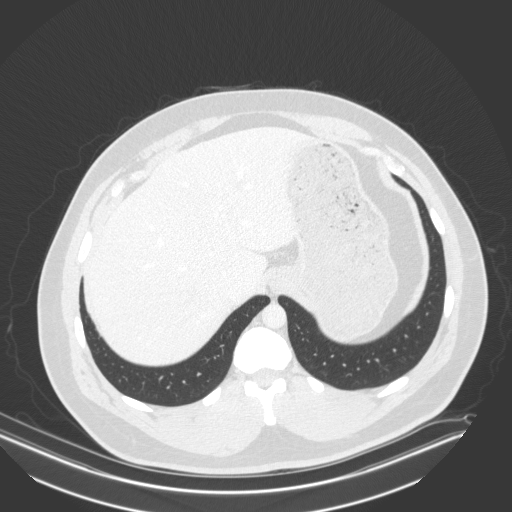
[im 46/175  lung]
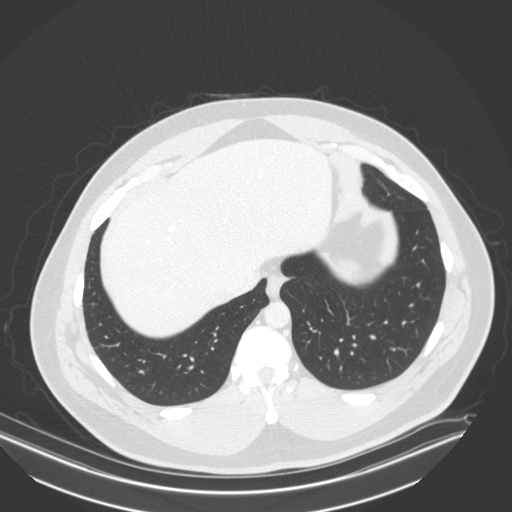
[im 59/175  mediastinal]
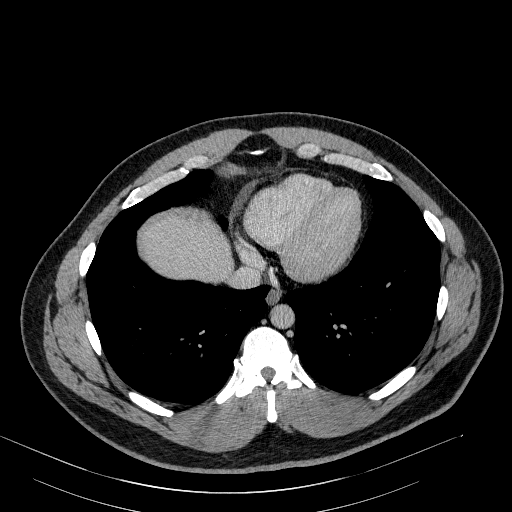
[im 59/175  lung]
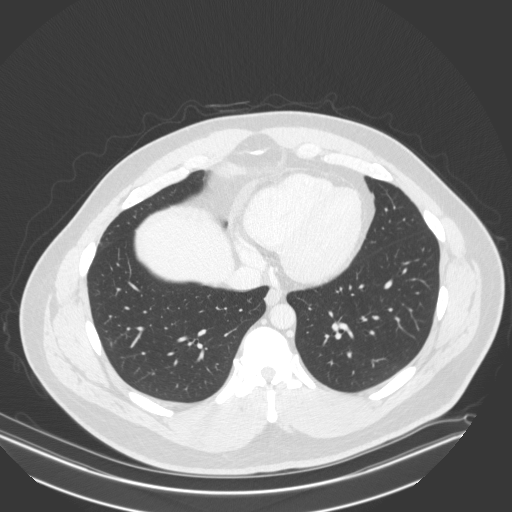
[im 70/175  lung]
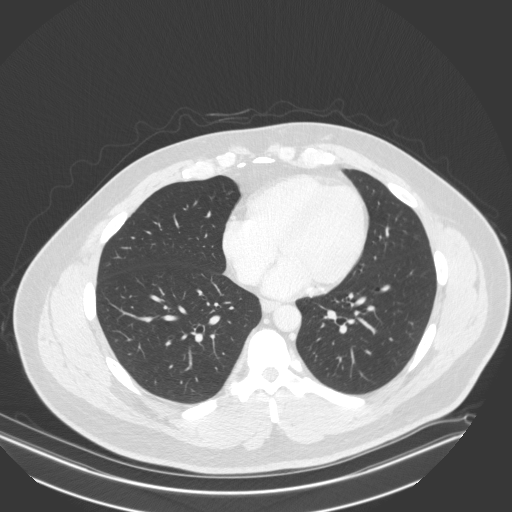
[im 78/175  lung]
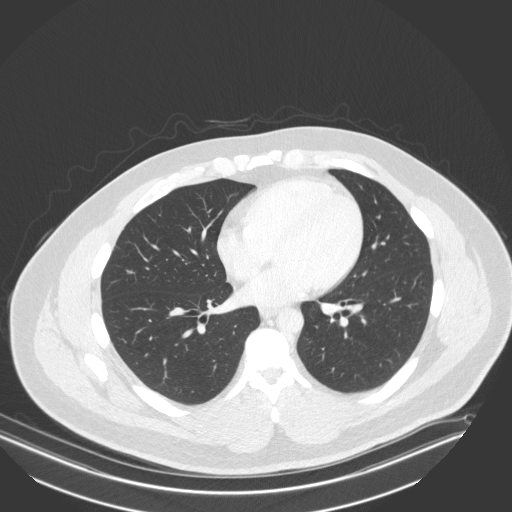
[im 84/175  lung]
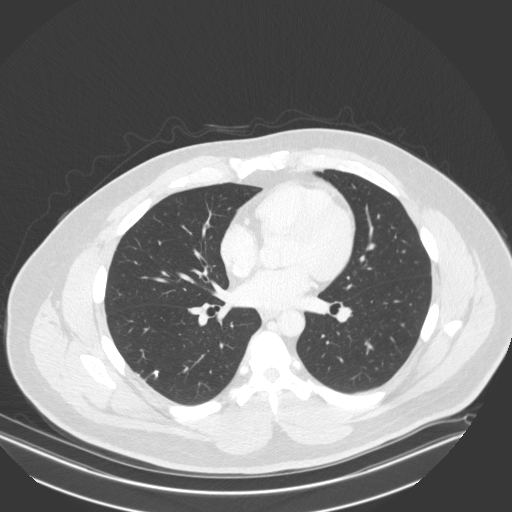
[im 92/175  mediastinal]
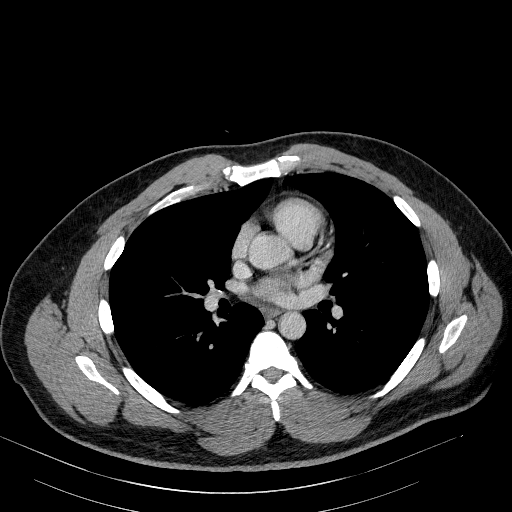
[im 92/175  lung]
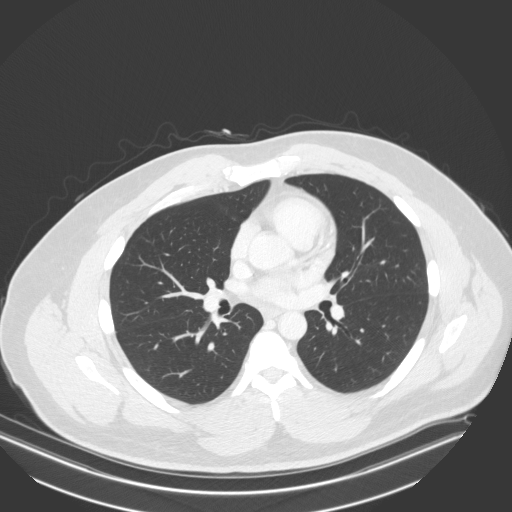
[im 104/175  lung]
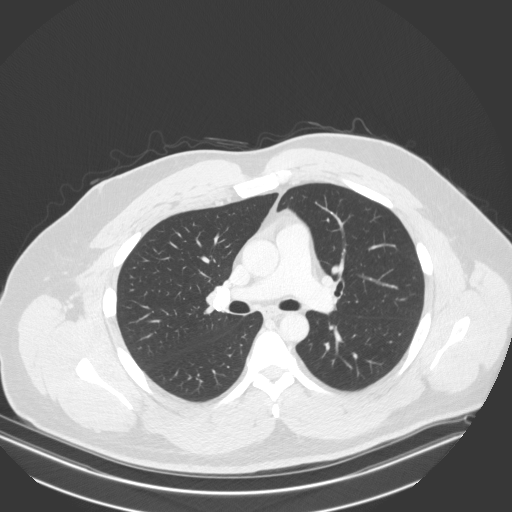
[im 110/175  lung]
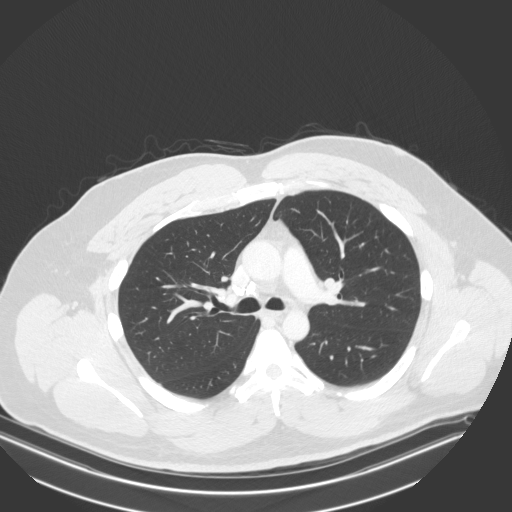
[im 123/175  lung]
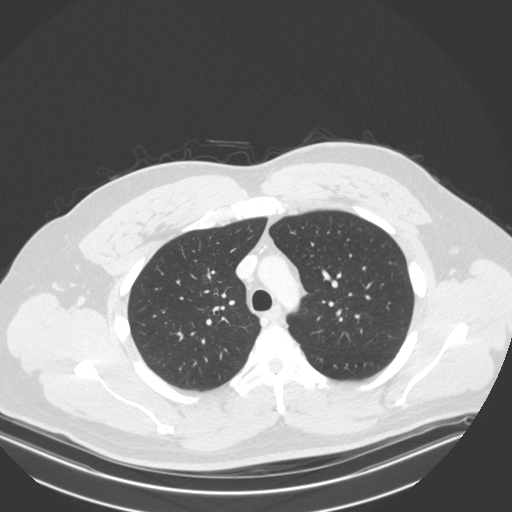
[im 136/175  mediastinal]
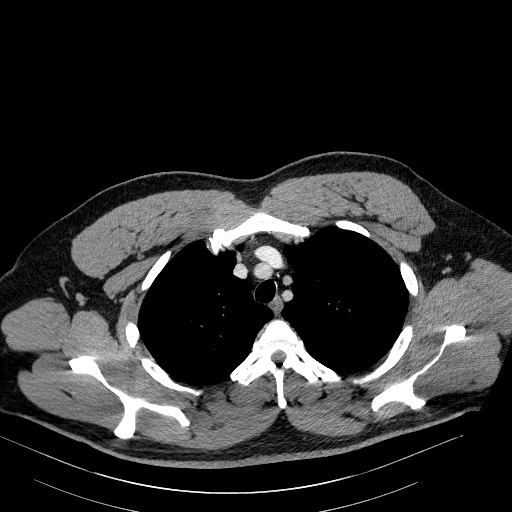
[im 136/175  lung]
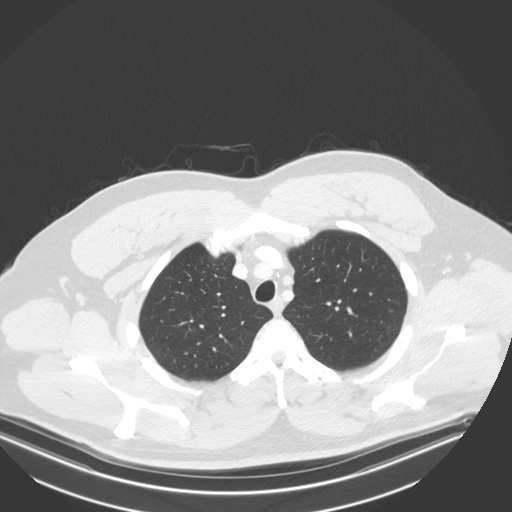
[im 142/175  lung]
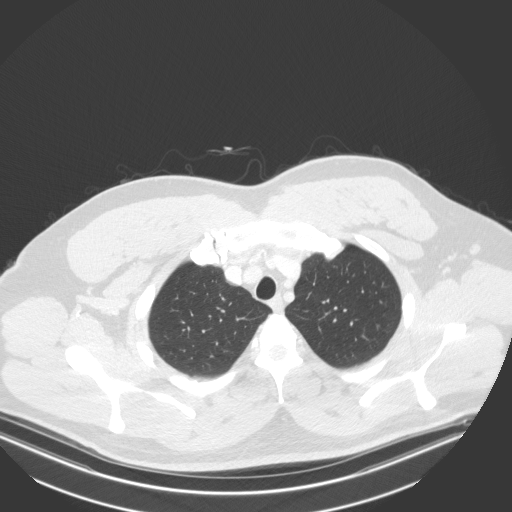
[im 155/175  lung]
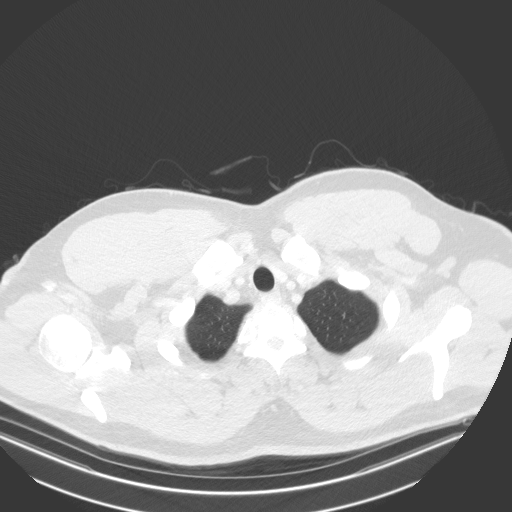
[im 168/175  lung]
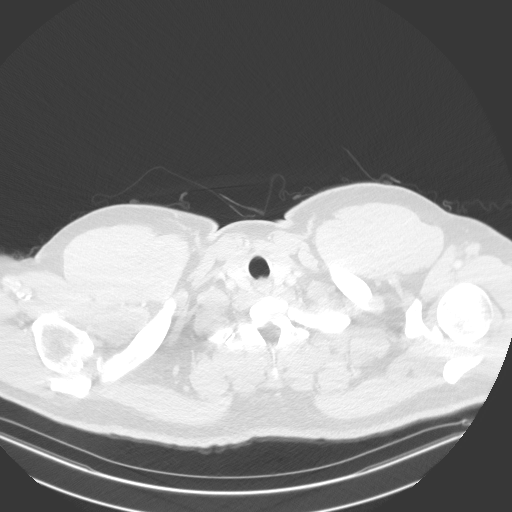

[16 of 34 positions shown; findings below may reference images not displayed]

FINDINGS: Cardiovascular: Aorta normal caliber. Minimal atherosclerotic
calcifications in the coronary arteries. No pericardial effusion or
cardiac abnormality.

Mediastinum/Nodes: Calcified RIGHT paratracheal and RIGHT hilar
adenopathy. No enlarged thoracic lymph nodes. Base of cervical
region normal appearance. Esophagus unremarkable.

Lungs/Pleura: Calcified granuloma RIGHT lower lobe. Lungs otherwise
clear. No infiltrate, pleural effusion, or pneumothorax. No
additional pulmonary mass/nodule.

Upper Abdomen: Unremarkable

Musculoskeletal: Normal appearance
IMPRESSION: Old granulomatous disease RIGHT chest.

Minimal coronary arterial calcification.

Otherwise normal exam.

## 2018-03-28 DIAGNOSIS — Z1211 Encounter for screening for malignant neoplasm of colon: Secondary | ICD-10-CM | POA: Diagnosis not present

## 2018-03-28 DIAGNOSIS — D126 Benign neoplasm of colon, unspecified: Secondary | ICD-10-CM | POA: Diagnosis not present

## 2018-03-28 DIAGNOSIS — K635 Polyp of colon: Secondary | ICD-10-CM | POA: Diagnosis not present

## 2018-03-30 ENCOUNTER — Ambulatory Visit (INDEPENDENT_AMBULATORY_CARE_PROVIDER_SITE_OTHER): Payer: Federal, State, Local not specified - PPO

## 2018-03-30 DIAGNOSIS — K635 Polyp of colon: Secondary | ICD-10-CM | POA: Diagnosis not present

## 2018-03-30 DIAGNOSIS — J309 Allergic rhinitis, unspecified: Secondary | ICD-10-CM | POA: Diagnosis not present

## 2018-03-30 DIAGNOSIS — D126 Benign neoplasm of colon, unspecified: Secondary | ICD-10-CM | POA: Diagnosis not present

## 2018-03-30 DIAGNOSIS — Z1211 Encounter for screening for malignant neoplasm of colon: Secondary | ICD-10-CM | POA: Diagnosis not present

## 2018-04-05 ENCOUNTER — Ambulatory Visit (INDEPENDENT_AMBULATORY_CARE_PROVIDER_SITE_OTHER): Payer: Federal, State, Local not specified - PPO | Admitting: *Deleted

## 2018-04-05 DIAGNOSIS — J309 Allergic rhinitis, unspecified: Secondary | ICD-10-CM

## 2018-04-07 ENCOUNTER — Ambulatory Visit (INDEPENDENT_AMBULATORY_CARE_PROVIDER_SITE_OTHER): Payer: Federal, State, Local not specified - PPO | Admitting: *Deleted

## 2018-04-07 DIAGNOSIS — J309 Allergic rhinitis, unspecified: Secondary | ICD-10-CM

## 2018-04-11 DIAGNOSIS — G4733 Obstructive sleep apnea (adult) (pediatric): Secondary | ICD-10-CM | POA: Diagnosis not present

## 2018-04-13 ENCOUNTER — Ambulatory Visit (INDEPENDENT_AMBULATORY_CARE_PROVIDER_SITE_OTHER): Payer: Federal, State, Local not specified - PPO | Admitting: *Deleted

## 2018-04-13 DIAGNOSIS — J309 Allergic rhinitis, unspecified: Secondary | ICD-10-CM | POA: Diagnosis not present

## 2018-04-21 ENCOUNTER — Ambulatory Visit (INDEPENDENT_AMBULATORY_CARE_PROVIDER_SITE_OTHER): Payer: Federal, State, Local not specified - PPO

## 2018-04-21 DIAGNOSIS — J309 Allergic rhinitis, unspecified: Secondary | ICD-10-CM | POA: Diagnosis not present

## 2018-05-03 ENCOUNTER — Ambulatory Visit (INDEPENDENT_AMBULATORY_CARE_PROVIDER_SITE_OTHER): Payer: Federal, State, Local not specified - PPO | Admitting: *Deleted

## 2018-05-03 DIAGNOSIS — J309 Allergic rhinitis, unspecified: Secondary | ICD-10-CM | POA: Diagnosis not present

## 2018-05-06 ENCOUNTER — Encounter: Payer: Self-pay | Admitting: *Deleted

## 2018-05-06 DIAGNOSIS — J3089 Other allergic rhinitis: Secondary | ICD-10-CM | POA: Diagnosis not present

## 2018-05-06 NOTE — Progress Notes (Signed)
Maintenance vial made. Exp: 05-07-19. hv 

## 2018-05-10 ENCOUNTER — Ambulatory Visit (INDEPENDENT_AMBULATORY_CARE_PROVIDER_SITE_OTHER): Payer: Federal, State, Local not specified - PPO | Admitting: *Deleted

## 2018-05-10 DIAGNOSIS — J309 Allergic rhinitis, unspecified: Secondary | ICD-10-CM

## 2018-05-19 ENCOUNTER — Ambulatory Visit (INDEPENDENT_AMBULATORY_CARE_PROVIDER_SITE_OTHER): Payer: Federal, State, Local not specified - PPO | Admitting: *Deleted

## 2018-05-19 DIAGNOSIS — J309 Allergic rhinitis, unspecified: Secondary | ICD-10-CM | POA: Diagnosis not present

## 2018-05-26 ENCOUNTER — Ambulatory Visit (INDEPENDENT_AMBULATORY_CARE_PROVIDER_SITE_OTHER): Payer: Federal, State, Local not specified - PPO

## 2018-05-26 DIAGNOSIS — J309 Allergic rhinitis, unspecified: Secondary | ICD-10-CM

## 2018-06-02 ENCOUNTER — Ambulatory Visit (INDEPENDENT_AMBULATORY_CARE_PROVIDER_SITE_OTHER): Payer: Federal, State, Local not specified - PPO | Admitting: *Deleted

## 2018-06-02 DIAGNOSIS — J309 Allergic rhinitis, unspecified: Secondary | ICD-10-CM | POA: Diagnosis not present

## 2018-06-16 ENCOUNTER — Ambulatory Visit (INDEPENDENT_AMBULATORY_CARE_PROVIDER_SITE_OTHER): Payer: Federal, State, Local not specified - PPO | Admitting: *Deleted

## 2018-06-16 DIAGNOSIS — J309 Allergic rhinitis, unspecified: Secondary | ICD-10-CM

## 2018-06-23 ENCOUNTER — Ambulatory Visit (INDEPENDENT_AMBULATORY_CARE_PROVIDER_SITE_OTHER): Payer: Federal, State, Local not specified - PPO | Admitting: *Deleted

## 2018-06-23 DIAGNOSIS — J309 Allergic rhinitis, unspecified: Secondary | ICD-10-CM | POA: Diagnosis not present

## 2018-06-27 ENCOUNTER — Other Ambulatory Visit: Payer: Self-pay | Admitting: Allergy and Immunology

## 2018-06-30 ENCOUNTER — Ambulatory Visit (INDEPENDENT_AMBULATORY_CARE_PROVIDER_SITE_OTHER): Payer: Federal, State, Local not specified - PPO | Admitting: *Deleted

## 2018-06-30 DIAGNOSIS — J309 Allergic rhinitis, unspecified: Secondary | ICD-10-CM

## 2018-07-02 DIAGNOSIS — R300 Dysuria: Secondary | ICD-10-CM | POA: Diagnosis not present

## 2018-07-02 DIAGNOSIS — S29012A Strain of muscle and tendon of back wall of thorax, initial encounter: Secondary | ICD-10-CM | POA: Diagnosis not present

## 2018-07-02 DIAGNOSIS — J22 Unspecified acute lower respiratory infection: Secondary | ICD-10-CM | POA: Diagnosis not present

## 2018-07-06 ENCOUNTER — Ambulatory Visit (INDEPENDENT_AMBULATORY_CARE_PROVIDER_SITE_OTHER): Payer: Federal, State, Local not specified - PPO

## 2018-07-06 DIAGNOSIS — J309 Allergic rhinitis, unspecified: Secondary | ICD-10-CM | POA: Diagnosis not present

## 2018-07-19 ENCOUNTER — Ambulatory Visit (INDEPENDENT_AMBULATORY_CARE_PROVIDER_SITE_OTHER): Payer: Federal, State, Local not specified - PPO | Admitting: *Deleted

## 2018-07-19 DIAGNOSIS — R51 Headache: Secondary | ICD-10-CM | POA: Diagnosis not present

## 2018-07-19 DIAGNOSIS — J309 Allergic rhinitis, unspecified: Secondary | ICD-10-CM

## 2018-07-19 DIAGNOSIS — M9901 Segmental and somatic dysfunction of cervical region: Secondary | ICD-10-CM | POA: Diagnosis not present

## 2018-07-19 DIAGNOSIS — M9903 Segmental and somatic dysfunction of lumbar region: Secondary | ICD-10-CM | POA: Diagnosis not present

## 2018-07-19 DIAGNOSIS — M9902 Segmental and somatic dysfunction of thoracic region: Secondary | ICD-10-CM | POA: Diagnosis not present

## 2018-07-20 DIAGNOSIS — R51 Headache: Secondary | ICD-10-CM | POA: Diagnosis not present

## 2018-07-20 DIAGNOSIS — M9902 Segmental and somatic dysfunction of thoracic region: Secondary | ICD-10-CM | POA: Diagnosis not present

## 2018-07-20 DIAGNOSIS — M9901 Segmental and somatic dysfunction of cervical region: Secondary | ICD-10-CM | POA: Diagnosis not present

## 2018-07-20 DIAGNOSIS — M9903 Segmental and somatic dysfunction of lumbar region: Secondary | ICD-10-CM | POA: Diagnosis not present

## 2018-07-21 DIAGNOSIS — R51 Headache: Secondary | ICD-10-CM | POA: Diagnosis not present

## 2018-07-21 DIAGNOSIS — M9902 Segmental and somatic dysfunction of thoracic region: Secondary | ICD-10-CM | POA: Diagnosis not present

## 2018-07-21 DIAGNOSIS — M9903 Segmental and somatic dysfunction of lumbar region: Secondary | ICD-10-CM | POA: Diagnosis not present

## 2018-07-21 DIAGNOSIS — M9901 Segmental and somatic dysfunction of cervical region: Secondary | ICD-10-CM | POA: Diagnosis not present

## 2018-07-26 ENCOUNTER — Ambulatory Visit (INDEPENDENT_AMBULATORY_CARE_PROVIDER_SITE_OTHER): Payer: Federal, State, Local not specified - PPO | Admitting: *Deleted

## 2018-07-26 DIAGNOSIS — J309 Allergic rhinitis, unspecified: Secondary | ICD-10-CM

## 2018-08-03 DIAGNOSIS — M5412 Radiculopathy, cervical region: Secondary | ICD-10-CM | POA: Diagnosis not present

## 2018-08-04 ENCOUNTER — Ambulatory Visit (INDEPENDENT_AMBULATORY_CARE_PROVIDER_SITE_OTHER): Payer: Federal, State, Local not specified - PPO | Admitting: *Deleted

## 2018-08-04 DIAGNOSIS — J309 Allergic rhinitis, unspecified: Secondary | ICD-10-CM

## 2018-08-05 DIAGNOSIS — M542 Cervicalgia: Secondary | ICD-10-CM | POA: Diagnosis not present

## 2018-08-05 DIAGNOSIS — M79601 Pain in right arm: Secondary | ICD-10-CM | POA: Diagnosis not present

## 2018-08-05 DIAGNOSIS — Z23 Encounter for immunization: Secondary | ICD-10-CM | POA: Diagnosis not present

## 2018-08-05 DIAGNOSIS — R937 Abnormal findings on diagnostic imaging of other parts of musculoskeletal system: Secondary | ICD-10-CM | POA: Diagnosis not present

## 2018-08-08 ENCOUNTER — Ambulatory Visit (INDEPENDENT_AMBULATORY_CARE_PROVIDER_SITE_OTHER): Payer: Federal, State, Local not specified - PPO | Admitting: *Deleted

## 2018-08-08 DIAGNOSIS — J309 Allergic rhinitis, unspecified: Secondary | ICD-10-CM | POA: Diagnosis not present

## 2018-08-09 ENCOUNTER — Other Ambulatory Visit: Payer: Self-pay | Admitting: Family Medicine

## 2018-08-09 DIAGNOSIS — M9901 Segmental and somatic dysfunction of cervical region: Secondary | ICD-10-CM | POA: Diagnosis not present

## 2018-08-09 DIAGNOSIS — M9902 Segmental and somatic dysfunction of thoracic region: Secondary | ICD-10-CM | POA: Diagnosis not present

## 2018-08-09 DIAGNOSIS — R937 Abnormal findings on diagnostic imaging of other parts of musculoskeletal system: Secondary | ICD-10-CM

## 2018-08-09 DIAGNOSIS — M9903 Segmental and somatic dysfunction of lumbar region: Secondary | ICD-10-CM | POA: Diagnosis not present

## 2018-08-09 DIAGNOSIS — R51 Headache: Secondary | ICD-10-CM | POA: Diagnosis not present

## 2018-08-11 DIAGNOSIS — M9902 Segmental and somatic dysfunction of thoracic region: Secondary | ICD-10-CM | POA: Diagnosis not present

## 2018-08-11 DIAGNOSIS — M9903 Segmental and somatic dysfunction of lumbar region: Secondary | ICD-10-CM | POA: Diagnosis not present

## 2018-08-11 DIAGNOSIS — R51 Headache: Secondary | ICD-10-CM | POA: Diagnosis not present

## 2018-08-11 DIAGNOSIS — M9901 Segmental and somatic dysfunction of cervical region: Secondary | ICD-10-CM | POA: Diagnosis not present

## 2018-08-15 ENCOUNTER — Other Ambulatory Visit: Payer: Federal, State, Local not specified - PPO

## 2018-08-17 ENCOUNTER — Ambulatory Visit
Admission: RE | Admit: 2018-08-17 | Discharge: 2018-08-17 | Disposition: A | Discharge status: Home / Self Care | Payer: Federal, State, Local not specified - PPO | Source: Ambulatory Visit | Attending: Family Medicine | Admitting: Family Medicine

## 2018-08-17 DIAGNOSIS — R937 Abnormal findings on diagnostic imaging of other parts of musculoskeletal system: Secondary | ICD-10-CM

## 2018-08-17 DIAGNOSIS — I6523 Occlusion and stenosis of bilateral carotid arteries: Secondary | ICD-10-CM | POA: Diagnosis not present

## 2018-08-19 ENCOUNTER — Ambulatory Visit (INDEPENDENT_AMBULATORY_CARE_PROVIDER_SITE_OTHER): Payer: Federal, State, Local not specified - PPO

## 2018-08-19 DIAGNOSIS — J309 Allergic rhinitis, unspecified: Secondary | ICD-10-CM

## 2018-08-25 ENCOUNTER — Ambulatory Visit (INDEPENDENT_AMBULATORY_CARE_PROVIDER_SITE_OTHER): Payer: Federal, State, Local not specified - PPO | Admitting: *Deleted

## 2018-08-25 DIAGNOSIS — M9902 Segmental and somatic dysfunction of thoracic region: Secondary | ICD-10-CM | POA: Diagnosis not present

## 2018-08-25 DIAGNOSIS — J309 Allergic rhinitis, unspecified: Secondary | ICD-10-CM | POA: Diagnosis not present

## 2018-08-25 DIAGNOSIS — M9901 Segmental and somatic dysfunction of cervical region: Secondary | ICD-10-CM | POA: Diagnosis not present

## 2018-08-25 DIAGNOSIS — M9903 Segmental and somatic dysfunction of lumbar region: Secondary | ICD-10-CM | POA: Diagnosis not present

## 2018-08-25 DIAGNOSIS — R51 Headache: Secondary | ICD-10-CM | POA: Diagnosis not present

## 2018-08-26 DIAGNOSIS — M9901 Segmental and somatic dysfunction of cervical region: Secondary | ICD-10-CM | POA: Diagnosis not present

## 2018-08-26 DIAGNOSIS — M9903 Segmental and somatic dysfunction of lumbar region: Secondary | ICD-10-CM | POA: Diagnosis not present

## 2018-08-26 DIAGNOSIS — M9902 Segmental and somatic dysfunction of thoracic region: Secondary | ICD-10-CM | POA: Diagnosis not present

## 2018-08-26 DIAGNOSIS — R51 Headache: Secondary | ICD-10-CM | POA: Diagnosis not present

## 2018-08-29 DIAGNOSIS — M9903 Segmental and somatic dysfunction of lumbar region: Secondary | ICD-10-CM | POA: Diagnosis not present

## 2018-08-29 DIAGNOSIS — M9902 Segmental and somatic dysfunction of thoracic region: Secondary | ICD-10-CM | POA: Diagnosis not present

## 2018-08-29 DIAGNOSIS — R51 Headache: Secondary | ICD-10-CM | POA: Diagnosis not present

## 2018-08-29 DIAGNOSIS — M9901 Segmental and somatic dysfunction of cervical region: Secondary | ICD-10-CM | POA: Diagnosis not present

## 2018-08-30 ENCOUNTER — Other Ambulatory Visit: Payer: Self-pay | Admitting: Family Medicine

## 2018-08-30 DIAGNOSIS — R9389 Abnormal findings on diagnostic imaging of other specified body structures: Secondary | ICD-10-CM

## 2018-08-31 DIAGNOSIS — M9901 Segmental and somatic dysfunction of cervical region: Secondary | ICD-10-CM | POA: Diagnosis not present

## 2018-08-31 DIAGNOSIS — J0100 Acute maxillary sinusitis, unspecified: Secondary | ICD-10-CM | POA: Diagnosis not present

## 2018-08-31 DIAGNOSIS — H9202 Otalgia, left ear: Secondary | ICD-10-CM | POA: Diagnosis not present

## 2018-08-31 DIAGNOSIS — J3089 Other allergic rhinitis: Secondary | ICD-10-CM | POA: Diagnosis not present

## 2018-08-31 DIAGNOSIS — M9903 Segmental and somatic dysfunction of lumbar region: Secondary | ICD-10-CM | POA: Diagnosis not present

## 2018-08-31 DIAGNOSIS — R51 Headache: Secondary | ICD-10-CM | POA: Diagnosis not present

## 2018-08-31 DIAGNOSIS — M9902 Segmental and somatic dysfunction of thoracic region: Secondary | ICD-10-CM | POA: Diagnosis not present

## 2018-08-31 DIAGNOSIS — R05 Cough: Secondary | ICD-10-CM | POA: Diagnosis not present

## 2018-08-31 NOTE — Progress Notes (Signed)
VIAL EXP 11-10-18 

## 2018-09-06 ENCOUNTER — Ambulatory Visit
Admission: RE | Admit: 2018-09-06 | Discharge: 2018-09-06 | Disposition: A | Discharge status: Home / Self Care | Payer: Federal, State, Local not specified - PPO | Source: Ambulatory Visit | Attending: Family Medicine | Admitting: Family Medicine

## 2018-09-06 ENCOUNTER — Ambulatory Visit (INDEPENDENT_AMBULATORY_CARE_PROVIDER_SITE_OTHER): Payer: Federal, State, Local not specified - PPO | Admitting: *Deleted

## 2018-09-06 DIAGNOSIS — J309 Allergic rhinitis, unspecified: Secondary | ICD-10-CM | POA: Diagnosis not present

## 2018-09-06 DIAGNOSIS — M9902 Segmental and somatic dysfunction of thoracic region: Secondary | ICD-10-CM | POA: Diagnosis not present

## 2018-09-06 DIAGNOSIS — R51 Headache: Secondary | ICD-10-CM | POA: Diagnosis not present

## 2018-09-06 DIAGNOSIS — M9901 Segmental and somatic dysfunction of cervical region: Secondary | ICD-10-CM | POA: Diagnosis not present

## 2018-09-06 DIAGNOSIS — E041 Nontoxic single thyroid nodule: Secondary | ICD-10-CM | POA: Diagnosis not present

## 2018-09-06 DIAGNOSIS — R9389 Abnormal findings on diagnostic imaging of other specified body structures: Secondary | ICD-10-CM

## 2018-09-06 DIAGNOSIS — M9903 Segmental and somatic dysfunction of lumbar region: Secondary | ICD-10-CM | POA: Diagnosis not present

## 2018-09-08 DIAGNOSIS — M9903 Segmental and somatic dysfunction of lumbar region: Secondary | ICD-10-CM | POA: Diagnosis not present

## 2018-09-08 DIAGNOSIS — M9902 Segmental and somatic dysfunction of thoracic region: Secondary | ICD-10-CM | POA: Diagnosis not present

## 2018-09-08 DIAGNOSIS — M9901 Segmental and somatic dysfunction of cervical region: Secondary | ICD-10-CM | POA: Diagnosis not present

## 2018-09-08 DIAGNOSIS — R51 Headache: Secondary | ICD-10-CM | POA: Diagnosis not present

## 2018-09-14 DIAGNOSIS — R51 Headache: Secondary | ICD-10-CM | POA: Diagnosis not present

## 2018-09-14 DIAGNOSIS — M9903 Segmental and somatic dysfunction of lumbar region: Secondary | ICD-10-CM | POA: Diagnosis not present

## 2018-09-14 DIAGNOSIS — M9901 Segmental and somatic dysfunction of cervical region: Secondary | ICD-10-CM | POA: Diagnosis not present

## 2018-09-14 DIAGNOSIS — M9902 Segmental and somatic dysfunction of thoracic region: Secondary | ICD-10-CM | POA: Diagnosis not present

## 2018-09-20 ENCOUNTER — Ambulatory Visit (INDEPENDENT_AMBULATORY_CARE_PROVIDER_SITE_OTHER): Payer: Federal, State, Local not specified - PPO | Admitting: *Deleted

## 2018-09-20 DIAGNOSIS — J309 Allergic rhinitis, unspecified: Secondary | ICD-10-CM

## 2018-09-21 DIAGNOSIS — M9903 Segmental and somatic dysfunction of lumbar region: Secondary | ICD-10-CM | POA: Diagnosis not present

## 2018-09-21 DIAGNOSIS — R51 Headache: Secondary | ICD-10-CM | POA: Diagnosis not present

## 2018-09-21 DIAGNOSIS — M9901 Segmental and somatic dysfunction of cervical region: Secondary | ICD-10-CM | POA: Diagnosis not present

## 2018-09-21 DIAGNOSIS — M9902 Segmental and somatic dysfunction of thoracic region: Secondary | ICD-10-CM | POA: Diagnosis not present

## 2018-09-27 DIAGNOSIS — Z79899 Other long term (current) drug therapy: Secondary | ICD-10-CM | POA: Diagnosis not present

## 2018-09-27 DIAGNOSIS — F419 Anxiety disorder, unspecified: Secondary | ICD-10-CM | POA: Diagnosis not present

## 2018-09-27 DIAGNOSIS — J309 Allergic rhinitis, unspecified: Secondary | ICD-10-CM | POA: Diagnosis not present

## 2018-09-27 DIAGNOSIS — E7800 Pure hypercholesterolemia, unspecified: Secondary | ICD-10-CM | POA: Diagnosis not present

## 2018-09-27 DIAGNOSIS — G4733 Obstructive sleep apnea (adult) (pediatric): Secondary | ICD-10-CM | POA: Diagnosis not present

## 2018-09-27 DIAGNOSIS — Z0000 Encounter for general adult medical examination without abnormal findings: Secondary | ICD-10-CM | POA: Diagnosis not present

## 2018-09-27 DIAGNOSIS — Z131 Encounter for screening for diabetes mellitus: Secondary | ICD-10-CM | POA: Diagnosis not present

## 2018-09-27 DIAGNOSIS — Z125 Encounter for screening for malignant neoplasm of prostate: Secondary | ICD-10-CM | POA: Diagnosis not present

## 2018-09-29 ENCOUNTER — Ambulatory Visit (INDEPENDENT_AMBULATORY_CARE_PROVIDER_SITE_OTHER): Payer: Federal, State, Local not specified - PPO | Admitting: *Deleted

## 2018-09-29 DIAGNOSIS — J309 Allergic rhinitis, unspecified: Secondary | ICD-10-CM | POA: Diagnosis not present

## 2018-09-30 DIAGNOSIS — M9902 Segmental and somatic dysfunction of thoracic region: Secondary | ICD-10-CM | POA: Diagnosis not present

## 2018-09-30 DIAGNOSIS — R51 Headache: Secondary | ICD-10-CM | POA: Diagnosis not present

## 2018-09-30 DIAGNOSIS — M9903 Segmental and somatic dysfunction of lumbar region: Secondary | ICD-10-CM | POA: Diagnosis not present

## 2018-09-30 DIAGNOSIS — M9901 Segmental and somatic dysfunction of cervical region: Secondary | ICD-10-CM | POA: Diagnosis not present

## 2018-10-06 ENCOUNTER — Ambulatory Visit (INDEPENDENT_AMBULATORY_CARE_PROVIDER_SITE_OTHER): Payer: Federal, State, Local not specified - PPO | Admitting: *Deleted

## 2018-10-06 DIAGNOSIS — J309 Allergic rhinitis, unspecified: Secondary | ICD-10-CM | POA: Diagnosis not present

## 2018-10-14 ENCOUNTER — Ambulatory Visit (INDEPENDENT_AMBULATORY_CARE_PROVIDER_SITE_OTHER): Payer: Federal, State, Local not specified - PPO | Admitting: *Deleted

## 2018-10-14 DIAGNOSIS — J309 Allergic rhinitis, unspecified: Secondary | ICD-10-CM

## 2018-10-18 ENCOUNTER — Ambulatory Visit (INDEPENDENT_AMBULATORY_CARE_PROVIDER_SITE_OTHER): Payer: Federal, State, Local not specified - PPO | Admitting: *Deleted

## 2018-10-18 DIAGNOSIS — J309 Allergic rhinitis, unspecified: Secondary | ICD-10-CM | POA: Diagnosis not present

## 2018-11-02 ENCOUNTER — Ambulatory Visit (INDEPENDENT_AMBULATORY_CARE_PROVIDER_SITE_OTHER): Payer: Federal, State, Local not specified - PPO | Admitting: *Deleted

## 2018-11-02 DIAGNOSIS — J309 Allergic rhinitis, unspecified: Secondary | ICD-10-CM | POA: Diagnosis not present

## 2018-11-21 ENCOUNTER — Ambulatory Visit (INDEPENDENT_AMBULATORY_CARE_PROVIDER_SITE_OTHER): Payer: Federal, State, Local not specified - PPO

## 2018-11-21 DIAGNOSIS — J309 Allergic rhinitis, unspecified: Secondary | ICD-10-CM

## 2018-12-06 ENCOUNTER — Ambulatory Visit (INDEPENDENT_AMBULATORY_CARE_PROVIDER_SITE_OTHER): Payer: Federal, State, Local not specified - PPO | Admitting: *Deleted

## 2018-12-06 DIAGNOSIS — J309 Allergic rhinitis, unspecified: Secondary | ICD-10-CM

## 2018-12-28 ENCOUNTER — Ambulatory Visit (INDEPENDENT_AMBULATORY_CARE_PROVIDER_SITE_OTHER): Payer: Federal, State, Local not specified - PPO | Admitting: *Deleted

## 2018-12-28 DIAGNOSIS — J309 Allergic rhinitis, unspecified: Secondary | ICD-10-CM | POA: Diagnosis not present

## 2019-01-02 NOTE — Progress Notes (Signed)
EXP 01/03/20 

## 2019-01-03 DIAGNOSIS — J3089 Other allergic rhinitis: Secondary | ICD-10-CM | POA: Diagnosis not present

## 2019-02-08 DIAGNOSIS — G4733 Obstructive sleep apnea (adult) (pediatric): Secondary | ICD-10-CM | POA: Diagnosis not present

## 2019-02-10 ENCOUNTER — Ambulatory Visit (INDEPENDENT_AMBULATORY_CARE_PROVIDER_SITE_OTHER): Payer: Federal, State, Local not specified - PPO | Admitting: *Deleted

## 2019-02-10 DIAGNOSIS — G4733 Obstructive sleep apnea (adult) (pediatric): Secondary | ICD-10-CM | POA: Diagnosis not present

## 2019-02-10 DIAGNOSIS — F419 Anxiety disorder, unspecified: Secondary | ICD-10-CM | POA: Diagnosis not present

## 2019-02-10 DIAGNOSIS — R7303 Prediabetes: Secondary | ICD-10-CM | POA: Diagnosis not present

## 2019-02-10 DIAGNOSIS — J309 Allergic rhinitis, unspecified: Secondary | ICD-10-CM

## 2019-02-10 DIAGNOSIS — E7800 Pure hypercholesterolemia, unspecified: Secondary | ICD-10-CM | POA: Diagnosis not present

## 2019-02-16 ENCOUNTER — Ambulatory Visit (INDEPENDENT_AMBULATORY_CARE_PROVIDER_SITE_OTHER): Payer: Federal, State, Local not specified - PPO | Admitting: *Deleted

## 2019-02-16 DIAGNOSIS — J309 Allergic rhinitis, unspecified: Secondary | ICD-10-CM | POA: Diagnosis not present

## 2019-03-06 ENCOUNTER — Ambulatory Visit (INDEPENDENT_AMBULATORY_CARE_PROVIDER_SITE_OTHER): Payer: Federal, State, Local not specified - PPO

## 2019-03-06 DIAGNOSIS — J309 Allergic rhinitis, unspecified: Secondary | ICD-10-CM | POA: Diagnosis not present

## 2019-03-09 ENCOUNTER — Ambulatory Visit (INDEPENDENT_AMBULATORY_CARE_PROVIDER_SITE_OTHER): Payer: Federal, State, Local not specified - PPO

## 2019-03-09 DIAGNOSIS — J309 Allergic rhinitis, unspecified: Secondary | ICD-10-CM | POA: Diagnosis not present

## 2019-03-16 ENCOUNTER — Ambulatory Visit (INDEPENDENT_AMBULATORY_CARE_PROVIDER_SITE_OTHER): Payer: Federal, State, Local not specified - PPO

## 2019-03-16 DIAGNOSIS — J309 Allergic rhinitis, unspecified: Secondary | ICD-10-CM

## 2019-03-23 ENCOUNTER — Ambulatory Visit (INDEPENDENT_AMBULATORY_CARE_PROVIDER_SITE_OTHER): Payer: Federal, State, Local not specified - PPO

## 2019-03-23 DIAGNOSIS — J309 Allergic rhinitis, unspecified: Secondary | ICD-10-CM | POA: Diagnosis not present

## 2019-04-07 ENCOUNTER — Ambulatory Visit (INDEPENDENT_AMBULATORY_CARE_PROVIDER_SITE_OTHER): Payer: Federal, State, Local not specified - PPO | Admitting: *Deleted

## 2019-04-07 DIAGNOSIS — J309 Allergic rhinitis, unspecified: Secondary | ICD-10-CM

## 2019-04-20 ENCOUNTER — Ambulatory Visit (INDEPENDENT_AMBULATORY_CARE_PROVIDER_SITE_OTHER): Payer: Federal, State, Local not specified - PPO

## 2019-04-20 DIAGNOSIS — J309 Allergic rhinitis, unspecified: Secondary | ICD-10-CM | POA: Diagnosis not present

## 2019-05-03 DIAGNOSIS — J3089 Other allergic rhinitis: Secondary | ICD-10-CM | POA: Diagnosis not present

## 2019-05-03 NOTE — Progress Notes (Signed)
VIAL EXP 05-02-2020

## 2019-05-09 ENCOUNTER — Other Ambulatory Visit: Payer: Self-pay

## 2019-05-09 ENCOUNTER — Ambulatory Visit: Payer: Federal, State, Local not specified - PPO | Admitting: Allergy and Immunology

## 2019-05-09 ENCOUNTER — Encounter: Payer: Self-pay | Admitting: Allergy and Immunology

## 2019-05-09 VITALS — BP 122/76 | HR 85 | Temp 97.9°F | Resp 18 | Ht 74.0 in

## 2019-05-09 DIAGNOSIS — H1010 Acute atopic conjunctivitis, unspecified eye: Secondary | ICD-10-CM | POA: Diagnosis not present

## 2019-05-09 DIAGNOSIS — J301 Allergic rhinitis due to pollen: Secondary | ICD-10-CM

## 2019-05-09 DIAGNOSIS — J3089 Other allergic rhinitis: Secondary | ICD-10-CM

## 2019-05-09 MED ORDER — AZELASTINE HCL 0.1 % NA SOLN: 1.0000 | Freq: Two times a day (BID) | NASAL | 5 refills | Status: DC

## 2019-05-09 MED ORDER — HYDROCORTISONE 1 % EX LOTN: 1.0000 "application " | TOPICAL_LOTION | Freq: Two times a day (BID) | CUTANEOUS | 0 refills | Status: DC

## 2019-05-09 MED ORDER — MONTELUKAST SODIUM 10 MG PO TABS: ORAL_TABLET | ORAL | 5 refills | Status: DC

## 2019-05-09 MED ORDER — EPINEPHRINE 0.3 MG/0.3ML IJ SOSY: PREFILLED_SYRINGE | INTRAMUSCULAR | 3 refills | Status: DC

## 2019-05-09 NOTE — Progress Notes (Signed)
[place]   Follow-up Note  Referring Provider: Sigmund HazelMiller, Lisa, MD Primary Provider: Sigmund HazelMiller, Lisa, MD Date of Office Visit: 05/09/2019  Subjective:   Tia AlertMatthew Wade (DOB: November 10, 1965) is a 53 y.o. male who [facility] in re-evaluation of the following:  HPI: Susy FrizzleMatt returns to this clinic in evaluation of allergic rhinoconjunctivitis and headache syndrome.  I last saw him in this clinic on 04 January 2018.  He is undergoing a course of immunotherapy currently at every 2 weeks.  He continues to use a combination of a nasal antihistamine and a nasal steroid as needed and consistent use of montelukast.  It does not sound as though he has required a systemic steroid or an antibiotic to treat any type of airway issue.  He continues to have very good control of his headaches as long as he moderates his caffeine consumption.  He does have an issue with his ears.  They feel as though there is "burning".  He uses a Q-tip with alcohol and when he does so they really burn quite significantly.  He does not have any hearing loss or tinnitus or vertigo with these issues.  Allergies as of 05/09/2019   No Known Allergies     Medication List      Allegra Allergy 180 MG tablet Generic drug: fexofenadine Take 180 mg by mouth daily as needed for allergies or rhinitis.   azelastine 0.1 % nasal spray Commonly known as: ASTELIN Place 1 spray into both nostrils 2 (two) times daily. Use in each nostril as directed   EPINEPHrine 0.3 MG/0.3ML Sosy Use as directed for life-threatening allergic reaction.   FLAX SEED OIL PO Take 1 tablet by mouth daily.   fluticasone 50 MCG/ACT nasal spray Commonly known as: FLONASE Place 1 spray into both nostrils 2 (two) times daily.   montelukast 10 MG tablet Commonly known as: SINGULAIR TAKE 1 TABLET BY MOUTH EVERYDAY AT BEDTIME   MULTIVITAMIN PO Take 1 tablet by mouth  daily.   Olopatadine HCl 0.7 % Soln Commonly known as: Pazeo Place 1 drop into both eyes 1 day or 1 dose.   OMEGA 3 PO Take 1 tablet by mouth daily.   sertraline 25 MG tablet Commonly known as: ZOLOFT Take 1 tablet (25 mg total) by mouth daily.       Past Medical History:  Diagnosis Date  . Allergic rhinitis   . Hypertension     Past Surgical History:  Procedure Laterality Date  . SEPTOPLASTY Bilateral 07/2016  . SINOSCOPY      Review of systems negative except as noted in HPI / PMHx or noted below:  Review of Systems  Constitutional: Negative.   HENT: Negative.   Eyes: Negative.   Respiratory: Negative.   Cardiovascular: Negative.   Gastrointestinal: Negative.   Genitourinary: Negative.   Musculoskeletal: Negative.   Skin: Negative.   Neurological: Negative.   Endo/Heme/Allergies: Negative.   Psychiatric/Behavioral: Negative.      Objective:   Vitals:   05/09/19 1108  BP: 122/76  Pulse: 85  Resp: 18  Temp: 97.9 F (36.6 C)  SpO2: 96%   Height: 6\' 2"  (188 cm)      Physical Exam Constitutional:      Appearance: He is not diaphoretic.  HENT:     Head: Normocephalic.     Right Ear: Tympanic membrane and external ear normal.     Left Ear: Tympanic  membrane and external ear normal.     Ears:     Comments: External otic canal with slight scale.    Nose: Nose normal. No mucosal edema or rhinorrhea.     Mouth/Throat:     Pharynx: Uvula midline. No oropharyngeal exudate.  Eyes:     Conjunctiva/sclera: Conjunctivae normal.  Neck:     Thyroid: No thyromegaly.     Trachea: Trachea normal. No tracheal tenderness or tracheal deviation.  Cardiovascular:     Rate and Rhythm: Normal rate and regular rhythm.     Heart sounds: Normal heart sounds, S1 normal and S2 normal. No murmur.  Pulmonary:     Effort: No respiratory distress.     Breath sounds: Normal breath sounds. No stridor. No wheezing or rales.  Lymphadenopathy:     Head:     Right side of  head: No tonsillar adenopathy.     Left side of head: No tonsillar adenopathy.     Cervical: No cervical adenopathy.  Skin:    Findings: No erythema or rash.     Nails: There is no clubbing.   Neurological:     Mental Status: He is alert.     Diagnostics: none   Assessment and Plan:   1. Perennial allergic rhinitis   2. Seasonal allergic rhinitis due to pollen   3. Seasonal allergic conjunctivitis      1. Continue to treat and prevent inflammation:   A.  Flonase - 1 spray each nostril twice a day  B. Azelastine - 1 spray each nostril twice a day  C. Montelukast 10mg  -1 tablet one time per day  2. If needed:   A. Nasal saline  B. OTC antihistamine  C. Pazeo - 1 drop each eye one time per day  3. Treat ears with OTC Hydrocortisone cream 1 time per day during ear discomfort.   4.  Continue immunotherapy and EpiPen  5. Return to clinic in 1 year or earlier if problem  6. Obtain fall flu vaccine (and COVID vaccine)  Matt has really done well while utilizing a course of immunotherapy and as well as anti-inflammatory agents for his airway directed as his atopic respiratory disease.  He will continue on this plan and I will see him back in this clinic in 1 year or earlier if problem.  He does appear to have some slight eczema involving his external otic canal and we will have him use over-the-counter hydrocortisone cream to deal with this issue.  Allena Katz, MD Allergy / Immunology [facility]

## 2019-05-09 NOTE — Patient Instructions (Signed)
    1. Continue to treat and prevent inflammation:   A.  Flonase - 1 spray each nostril twice a day  B. Azelastine - 1 spray each nostril twice a day  C. Montelukast 10mg  -1 tablet one time per day  2. If needed:   A. Nasal saline  B. OTC antihistamine  C. Pazeo - 1 drop each eye one time per day  3. Treat ears with OTC Hydrocortisone cream 1 time per day during ear discomfort.   4.  Continue immunotherapy and EpiPen  5. Return to clinic in 1 year or earlier if problem  6. Obtain fall flu vaccine (and COVID vaccine)

## 2019-05-10 ENCOUNTER — Encounter: Payer: Self-pay | Admitting: Allergy and Immunology

## 2019-05-12 DIAGNOSIS — J029 Acute pharyngitis, unspecified: Secondary | ICD-10-CM | POA: Diagnosis not present

## 2019-06-03 DIAGNOSIS — K5900 Constipation, unspecified: Secondary | ICD-10-CM | POA: Diagnosis not present

## 2019-06-03 DIAGNOSIS — R072 Precordial pain: Secondary | ICD-10-CM | POA: Diagnosis not present

## 2019-06-03 DIAGNOSIS — R945 Abnormal results of liver function studies: Secondary | ICD-10-CM | POA: Diagnosis not present

## 2019-06-03 DIAGNOSIS — E782 Mixed hyperlipidemia: Secondary | ICD-10-CM | POA: Diagnosis not present

## 2019-06-03 DIAGNOSIS — K219 Gastro-esophageal reflux disease without esophagitis: Secondary | ICD-10-CM | POA: Diagnosis not present

## 2019-06-03 DIAGNOSIS — Z131 Encounter for screening for diabetes mellitus: Secondary | ICD-10-CM | POA: Diagnosis not present

## 2019-06-03 DIAGNOSIS — Z1159 Encounter for screening for other viral diseases: Secondary | ICD-10-CM | POA: Diagnosis not present

## 2019-06-08 DIAGNOSIS — F3341 Major depressive disorder, recurrent, in partial remission: Secondary | ICD-10-CM | POA: Diagnosis not present

## 2019-06-08 DIAGNOSIS — F419 Anxiety disorder, unspecified: Secondary | ICD-10-CM | POA: Diagnosis not present

## 2019-06-08 DIAGNOSIS — E7800 Pure hypercholesterolemia, unspecified: Secondary | ICD-10-CM | POA: Diagnosis not present

## 2019-06-08 DIAGNOSIS — R7303 Prediabetes: Secondary | ICD-10-CM | POA: Diagnosis not present

## 2019-06-09 DIAGNOSIS — R1013 Epigastric pain: Secondary | ICD-10-CM | POA: Diagnosis not present

## 2019-06-09 DIAGNOSIS — R945 Abnormal results of liver function studies: Secondary | ICD-10-CM | POA: Diagnosis not present

## 2019-06-09 DIAGNOSIS — K219 Gastro-esophageal reflux disease without esophagitis: Secondary | ICD-10-CM | POA: Diagnosis not present

## 2019-06-09 DIAGNOSIS — R634 Abnormal weight loss: Secondary | ICD-10-CM | POA: Diagnosis not present

## 2019-06-13 DIAGNOSIS — K210 Gastro-esophageal reflux disease with esophagitis: Secondary | ICD-10-CM | POA: Diagnosis not present

## 2019-06-13 DIAGNOSIS — H60543 Acute eczematoid otitis externa, bilateral: Secondary | ICD-10-CM | POA: Diagnosis not present

## 2019-06-13 DIAGNOSIS — R51 Headache: Secondary | ICD-10-CM | POA: Diagnosis not present

## 2019-06-13 DIAGNOSIS — G4733 Obstructive sleep apnea (adult) (pediatric): Secondary | ICD-10-CM | POA: Diagnosis not present

## 2019-06-14 DIAGNOSIS — R1013 Epigastric pain: Secondary | ICD-10-CM | POA: Diagnosis not present

## 2019-06-14 DIAGNOSIS — R945 Abnormal results of liver function studies: Secondary | ICD-10-CM | POA: Diagnosis not present

## 2019-06-21 DIAGNOSIS — M79671 Pain in right foot: Secondary | ICD-10-CM | POA: Diagnosis not present

## 2019-06-21 DIAGNOSIS — M722 Plantar fascial fibromatosis: Secondary | ICD-10-CM | POA: Diagnosis not present

## 2019-06-21 DIAGNOSIS — M21611 Bunion of right foot: Secondary | ICD-10-CM | POA: Diagnosis not present

## 2019-06-21 DIAGNOSIS — M2041 Other hammer toe(s) (acquired), right foot: Secondary | ICD-10-CM | POA: Diagnosis not present

## 2019-07-05 DIAGNOSIS — G4733 Obstructive sleep apnea (adult) (pediatric): Secondary | ICD-10-CM | POA: Diagnosis not present

## 2019-07-07 DIAGNOSIS — R1013 Epigastric pain: Secondary | ICD-10-CM | POA: Diagnosis not present

## 2019-07-07 DIAGNOSIS — R634 Abnormal weight loss: Secondary | ICD-10-CM | POA: Diagnosis not present

## 2019-07-07 DIAGNOSIS — K219 Gastro-esophageal reflux disease without esophagitis: Secondary | ICD-10-CM | POA: Diagnosis not present

## 2019-07-27 ENCOUNTER — Other Ambulatory Visit: Payer: Self-pay

## 2019-07-27 DIAGNOSIS — K2100 Gastro-esophageal reflux disease with esophagitis, without bleeding: Secondary | ICD-10-CM | POA: Diagnosis not present

## 2019-07-27 DIAGNOSIS — Z20822 Contact with and (suspected) exposure to covid-19: Secondary | ICD-10-CM

## 2019-07-27 DIAGNOSIS — J988 Other specified respiratory disorders: Secondary | ICD-10-CM | POA: Diagnosis not present

## 2019-07-27 DIAGNOSIS — J029 Acute pharyngitis, unspecified: Secondary | ICD-10-CM | POA: Diagnosis not present

## 2019-07-28 LAB — NOVEL CORONAVIRUS, NAA: SARS-CoV-2, NAA: NOT DETECTED

## 2019-08-04 DIAGNOSIS — Z1159 Encounter for screening for other viral diseases: Secondary | ICD-10-CM | POA: Diagnosis not present

## 2019-08-09 DIAGNOSIS — R12 Heartburn: Secondary | ICD-10-CM | POA: Diagnosis not present

## 2019-08-09 DIAGNOSIS — K228 Other specified diseases of esophagus: Secondary | ICD-10-CM | POA: Diagnosis not present

## 2019-08-09 DIAGNOSIS — R1013 Epigastric pain: Secondary | ICD-10-CM | POA: Diagnosis not present

## 2019-08-09 DIAGNOSIS — B9681 Helicobacter pylori [H. pylori] as the cause of diseases classified elsewhere: Secondary | ICD-10-CM | POA: Diagnosis not present

## 2019-08-09 DIAGNOSIS — K2930 Chronic superficial gastritis without bleeding: Secondary | ICD-10-CM | POA: Diagnosis not present

## 2019-08-09 DIAGNOSIS — K219 Gastro-esophageal reflux disease without esophagitis: Secondary | ICD-10-CM | POA: Diagnosis not present

## 2019-08-09 DIAGNOSIS — K2951 Unspecified chronic gastritis with bleeding: Secondary | ICD-10-CM | POA: Diagnosis not present

## 2019-10-03 DIAGNOSIS — H9190 Unspecified hearing loss, unspecified ear: Secondary | ICD-10-CM | POA: Diagnosis not present

## 2019-10-03 DIAGNOSIS — Z79899 Other long term (current) drug therapy: Secondary | ICD-10-CM | POA: Diagnosis not present

## 2019-10-03 DIAGNOSIS — Z23 Encounter for immunization: Secondary | ICD-10-CM | POA: Diagnosis not present

## 2019-10-03 DIAGNOSIS — H9319 Tinnitus, unspecified ear: Secondary | ICD-10-CM | POA: Diagnosis not present

## 2019-10-03 DIAGNOSIS — F3341 Major depressive disorder, recurrent, in partial remission: Secondary | ICD-10-CM | POA: Diagnosis not present

## 2019-10-03 DIAGNOSIS — E7800 Pure hypercholesterolemia, unspecified: Secondary | ICD-10-CM | POA: Diagnosis not present

## 2019-10-03 DIAGNOSIS — R7303 Prediabetes: Secondary | ICD-10-CM | POA: Diagnosis not present

## 2019-10-03 DIAGNOSIS — Z0000 Encounter for general adult medical examination without abnormal findings: Secondary | ICD-10-CM | POA: Diagnosis not present

## 2019-10-03 DIAGNOSIS — G4733 Obstructive sleep apnea (adult) (pediatric): Secondary | ICD-10-CM | POA: Diagnosis not present

## 2019-11-03 DIAGNOSIS — Z86010 Personal history of colonic polyps: Secondary | ICD-10-CM | POA: Diagnosis not present

## 2019-11-03 DIAGNOSIS — A048 Other specified bacterial intestinal infections: Secondary | ICD-10-CM | POA: Diagnosis not present

## 2019-11-03 DIAGNOSIS — R1013 Epigastric pain: Secondary | ICD-10-CM | POA: Diagnosis not present

## 2019-11-21 DIAGNOSIS — A048 Other specified bacterial intestinal infections: Secondary | ICD-10-CM | POA: Diagnosis not present

## 2020-02-16 DIAGNOSIS — G4733 Obstructive sleep apnea (adult) (pediatric): Secondary | ICD-10-CM | POA: Diagnosis not present

## 2020-09-21 DIAGNOSIS — G4733 Obstructive sleep apnea (adult) (pediatric): Secondary | ICD-10-CM | POA: Diagnosis not present

## 2020-10-07 DIAGNOSIS — F419 Anxiety disorder, unspecified: Secondary | ICD-10-CM | POA: Diagnosis not present

## 2020-10-07 DIAGNOSIS — Z0000 Encounter for general adult medical examination without abnormal findings: Secondary | ICD-10-CM | POA: Diagnosis not present

## 2020-10-07 DIAGNOSIS — E7800 Pure hypercholesterolemia, unspecified: Secondary | ICD-10-CM | POA: Diagnosis not present

## 2020-10-07 DIAGNOSIS — R252 Cramp and spasm: Secondary | ICD-10-CM | POA: Diagnosis not present

## 2020-10-07 DIAGNOSIS — R7303 Prediabetes: Secondary | ICD-10-CM | POA: Diagnosis not present

## 2020-10-07 DIAGNOSIS — G4733 Obstructive sleep apnea (adult) (pediatric): Secondary | ICD-10-CM | POA: Diagnosis not present

## 2020-10-07 DIAGNOSIS — Z125 Encounter for screening for malignant neoplasm of prostate: Secondary | ICD-10-CM | POA: Diagnosis not present

## 2020-10-24 ENCOUNTER — Other Ambulatory Visit: Payer: Self-pay

## 2020-10-24 ENCOUNTER — Encounter: Payer: Federal, State, Local not specified - PPO | Attending: Family Medicine | Admitting: Dietician

## 2020-10-24 ENCOUNTER — Encounter: Payer: Self-pay | Admitting: Dietician

## 2020-10-24 VITALS — Ht 74.0 in | Wt 255.4 lb

## 2020-10-24 DIAGNOSIS — R7303 Prediabetes: Secondary | ICD-10-CM | POA: Diagnosis not present

## 2020-10-24 DIAGNOSIS — E782 Mixed hyperlipidemia: Secondary | ICD-10-CM

## 2020-10-24 NOTE — Progress Notes (Signed)
Medical Nutrition Therapy  Appointment Start time:  1550  Appointment End time:  1700  Primary concerns today: Blood cholesterol  Referral diagnosis: R73.03 Prediabetes, E78.00 Pure Hypercholesterolemia Preferred learning style: No preference indicated Learning readiness: Ready   NUTRITION ASSESSMENT   Anthropometrics  Ht: 6'2" Wt: 255.4 lbs Body mass index is 32.79 kg/m.  Clinical Medical Hx: OSA, GERD, Hypercholesterolemia, H. 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 Pt enjoys his work. Pt cooks for his wife, and sometimes his daughter and grandson. Pt states he likes to grill meats, brisket and beef ribs are his favorite. Pt states that doesn't eat a lot of processed foods, but states that he will binge on dessert foods at times. Pt states that he only eats one meal a day between 4-6 pm. States he gets busy with work and doesn't focus on being hungry. Pt reports having a 64 oz water bottle he keeps with him and tries to finish it daily. Pt reports not exercising as much during the pandemic, and experiencing the loss of dozens of loved ones during the pandemic. States that it has been very stressful. Pt thinks these things have contributed to his prediabetes. Pt states he has no history of prediabetes, but has had high cholesterol for a long time. Pt reports history of weigh training and consistent exercise. States that he would cycle between consistent exercise, and no exercise for years previous. Pt states he would like to exercise in a gym, and that he has difficulty getting motivated to exercise at home. Pt has resistance bands to use at home. States he has been walking after he eats.    Estimated daily fluid intake: 50-60 oz Supplements: Fish Oil,  Men's multivitamin (occasionally) Sleep: Pt has OSA, uses C-PAP. Watches TV until 2 am, wakes up at 9 am. Feels somewhat rested. Stress / self-care: Was very high during pandemic due to loss of many people. Increased his  Current average weekly physical activity: ADLs, walks occasionally  82-XH Dietary Recall First Meal (9 am): 2 bowls of cereal, Raisin bran/Cheerios 2% milk Snack: none Second Meal: (4 pm) Salad with boiled eggs Snack: none Third Meal: none Snack: none Beverages: ~64oz Water, coffee fat free creamer and monk fruit   NUTRITION DIAGNOSIS  NB-1.1 Food and nutrition-related knowledge deficit As related to hypercholesterolemia.  As evidenced by serum cholesterol of 224, LDL of 133, and pt admission of eating high fat meats, and high fat/sugar desserts.   NUTRITION INTERVENTION  Nutrition education (E-1) on the following topics:  . Educate pt on the difference between LDL and HDL cholesterol. Educate pt on the factors that can increase and decrease HDL cholesterol. Including exercise to increase HDL, and tobacco use to decrease HDL. Educate pt on factors that can elevate LDL cholesterol, including high dietary intake of saturated fats. Educate pt on identifying sources of saturated fats, and how to make alternative food choices to lower saturated fat intake. Educate pt on the role of soluble fiber in binding to cholesterol in the GI tract an eliminating it from the body. Educate pt on dietary sources of soluble fiber. Educate pt on the potential dietary causes of hypertriglyceridemia.Educate on the role of elevated LDL,total cholesterol, and triglycerides on cardiovascular health. Educate pt on the role of physical activity in lowering LDL and increasing HDL  cholesterol. Educated patient on mindful eating, including listening to their body's hunger and satiety cues, as well as eating slowly and allowing meals to be more of a sensory experience.   Handouts Provided Include    Cholesterol lowering Nutrition Therapy Nutrition Care Manual  Learning Style & Readiness for Change Teaching method utilized: Visual & Auditory  Demonstrated degree of understanding via: Teach Back  Barriers to learning/adherence to lifestyle change: none  Goals Established by Pt  Work up to 150 minutes of physical activity, 30 minutes 5 days a week. Try to wait 30 minutes after a meal before you start walking.  Begin to recognize your sources of saturated fat, and consider ways to limit your intake. Every little bit counts.  Consider leaner meats at times, or smaller portions of fattier meats. As long as you still enjoy the foods you eat.  Begin to work with your resistance bands twice a week.  Work towards eating three meals a day, about 5-6 hours apart!  Have your breakfast within an hour of waking up.    MONITORING & EVALUATION Dietary intake, weekly physical activity, and saturated fat intake in 6 weeks.  Next Steps  Patient is to follow up with dietitian.

## 2020-10-24 NOTE — Patient Instructions (Addendum)
Work up to 150 minutes of physical activity, 30 minutes 5 days a week. Try to wait 30 minutes after a meal before you start walking.  Begin to recognize your sources of saturated fat, and consider ways to limit your intake. Every little bit counts.  Consider leaner meats at times, or smaller portions of fattier meats. As long as you still enjoy the foods you eat.  Begin to work with your resistance bands twice a week.  Work towards eating three meals a day, about 5-6 hours apart! Have your breakfast within an hour of waking up.

## 2020-12-05 ENCOUNTER — Ambulatory Visit: Payer: Federal, State, Local not specified - PPO | Admitting: Dietician

## 2021-01-02 DIAGNOSIS — A048 Other specified bacterial intestinal infections: Secondary | ICD-10-CM | POA: Diagnosis not present

## 2021-01-02 DIAGNOSIS — Z86010 Personal history of colonic polyps: Secondary | ICD-10-CM | POA: Diagnosis not present

## 2021-01-02 DIAGNOSIS — K219 Gastro-esophageal reflux disease without esophagitis: Secondary | ICD-10-CM | POA: Diagnosis not present

## 2021-01-13 DIAGNOSIS — R7303 Prediabetes: Secondary | ICD-10-CM | POA: Diagnosis not present

## 2021-01-13 DIAGNOSIS — A048 Other specified bacterial intestinal infections: Secondary | ICD-10-CM | POA: Diagnosis not present

## 2021-01-13 DIAGNOSIS — E7800 Pure hypercholesterolemia, unspecified: Secondary | ICD-10-CM | POA: Diagnosis not present

## 2021-03-18 DIAGNOSIS — R059 Cough, unspecified: Secondary | ICD-10-CM | POA: Diagnosis not present

## 2021-03-18 DIAGNOSIS — U071 COVID-19: Secondary | ICD-10-CM | POA: Diagnosis not present

## 2021-03-22 DIAGNOSIS — R0602 Shortness of breath: Secondary | ICD-10-CM | POA: Diagnosis not present

## 2021-05-14 DIAGNOSIS — E7800 Pure hypercholesterolemia, unspecified: Secondary | ICD-10-CM | POA: Diagnosis not present

## 2021-05-14 DIAGNOSIS — R7303 Prediabetes: Secondary | ICD-10-CM | POA: Diagnosis not present

## 2021-05-14 DIAGNOSIS — Z79899 Other long term (current) drug therapy: Secondary | ICD-10-CM | POA: Diagnosis not present

## 2021-05-21 DIAGNOSIS — U071 COVID-19: Secondary | ICD-10-CM | POA: Diagnosis not present

## 2021-09-29 DIAGNOSIS — G4733 Obstructive sleep apnea (adult) (pediatric): Secondary | ICD-10-CM | POA: Diagnosis not present

## 2021-10-30 DIAGNOSIS — J0100 Acute maxillary sinusitis, unspecified: Secondary | ICD-10-CM | POA: Diagnosis not present

## 2021-11-19 DIAGNOSIS — F419 Anxiety disorder, unspecified: Secondary | ICD-10-CM | POA: Diagnosis not present

## 2021-11-19 DIAGNOSIS — E7800 Pure hypercholesterolemia, unspecified: Secondary | ICD-10-CM | POA: Diagnosis not present

## 2021-11-19 DIAGNOSIS — Z0000 Encounter for general adult medical examination without abnormal findings: Secondary | ICD-10-CM | POA: Diagnosis not present

## 2021-11-19 DIAGNOSIS — R519 Headache, unspecified: Secondary | ICD-10-CM | POA: Diagnosis not present

## 2021-11-19 DIAGNOSIS — J309 Allergic rhinitis, unspecified: Secondary | ICD-10-CM | POA: Diagnosis not present

## 2021-11-19 DIAGNOSIS — Z86010 Personal history of colonic polyps: Secondary | ICD-10-CM | POA: Diagnosis not present

## 2021-11-19 DIAGNOSIS — Z125 Encounter for screening for malignant neoplasm of prostate: Secondary | ICD-10-CM | POA: Diagnosis not present

## 2021-11-19 DIAGNOSIS — R7303 Prediabetes: Secondary | ICD-10-CM | POA: Diagnosis not present

## 2021-11-28 ENCOUNTER — Other Ambulatory Visit: Payer: Self-pay | Admitting: Family Medicine

## 2021-11-28 DIAGNOSIS — R519 Headache, unspecified: Secondary | ICD-10-CM

## 2021-12-14 ENCOUNTER — Ambulatory Visit
Admission: RE | Admit: 2021-12-14 | Discharge: 2021-12-14 | Disposition: A | Discharge status: Home / Self Care | Payer: Federal, State, Local not specified - PPO | Source: Ambulatory Visit | Attending: Family Medicine | Admitting: Family Medicine

## 2021-12-14 ENCOUNTER — Other Ambulatory Visit: Payer: Self-pay

## 2021-12-14 DIAGNOSIS — R519 Headache, unspecified: Secondary | ICD-10-CM

## 2021-12-14 MED ORDER — GADOBENATE DIMEGLUMINE 529 MG/ML IV SOLN
20.0000 mL | Freq: Once | INTRAVENOUS | Status: AC | PRN
  Administered 2021-12-14: 20 mL via INTRAVENOUS

## 2022-01-01 DIAGNOSIS — G4733 Obstructive sleep apnea (adult) (pediatric): Secondary | ICD-10-CM | POA: Diagnosis not present

## 2022-01-06 DIAGNOSIS — Z23 Encounter for immunization: Secondary | ICD-10-CM | POA: Diagnosis not present

## 2022-02-01 DIAGNOSIS — G4733 Obstructive sleep apnea (adult) (pediatric): Secondary | ICD-10-CM | POA: Diagnosis not present

## 2022-03-03 DIAGNOSIS — G4733 Obstructive sleep apnea (adult) (pediatric): Secondary | ICD-10-CM | POA: Diagnosis not present

## 2022-07-17 DIAGNOSIS — M25511 Pain in right shoulder: Secondary | ICD-10-CM | POA: Diagnosis not present

## 2022-07-17 DIAGNOSIS — R7303 Prediabetes: Secondary | ICD-10-CM | POA: Diagnosis not present

## 2022-07-17 DIAGNOSIS — E7800 Pure hypercholesterolemia, unspecified: Secondary | ICD-10-CM | POA: Diagnosis not present

## 2022-07-21 DIAGNOSIS — G4733 Obstructive sleep apnea (adult) (pediatric): Secondary | ICD-10-CM | POA: Diagnosis not present

## 2022-07-29 DIAGNOSIS — G4733 Obstructive sleep apnea (adult) (pediatric): Secondary | ICD-10-CM | POA: Diagnosis not present

## 2022-08-29 DIAGNOSIS — G4733 Obstructive sleep apnea (adult) (pediatric): Secondary | ICD-10-CM | POA: Diagnosis not present

## 2022-09-14 DIAGNOSIS — G4733 Obstructive sleep apnea (adult) (pediatric): Secondary | ICD-10-CM | POA: Diagnosis not present

## 2022-09-28 DIAGNOSIS — G4733 Obstructive sleep apnea (adult) (pediatric): Secondary | ICD-10-CM | POA: Diagnosis not present

## 2022-10-14 DIAGNOSIS — G4733 Obstructive sleep apnea (adult) (pediatric): Secondary | ICD-10-CM | POA: Diagnosis not present

## 2022-10-29 DIAGNOSIS — G4733 Obstructive sleep apnea (adult) (pediatric): Secondary | ICD-10-CM | POA: Diagnosis not present

## 2022-11-02 DIAGNOSIS — G4733 Obstructive sleep apnea (adult) (pediatric): Secondary | ICD-10-CM | POA: Diagnosis not present

## 2022-11-14 DIAGNOSIS — G4733 Obstructive sleep apnea (adult) (pediatric): Secondary | ICD-10-CM | POA: Diagnosis not present

## 2022-11-29 DIAGNOSIS — G4733 Obstructive sleep apnea (adult) (pediatric): Secondary | ICD-10-CM | POA: Diagnosis not present

## 2022-12-15 DIAGNOSIS — G4733 Obstructive sleep apnea (adult) (pediatric): Secondary | ICD-10-CM | POA: Diagnosis not present

## 2022-12-28 DIAGNOSIS — G4733 Obstructive sleep apnea (adult) (pediatric): Secondary | ICD-10-CM | POA: Diagnosis not present

## 2023-01-13 DIAGNOSIS — G4733 Obstructive sleep apnea (adult) (pediatric): Secondary | ICD-10-CM | POA: Diagnosis not present

## 2023-02-04 DIAGNOSIS — G4733 Obstructive sleep apnea (adult) (pediatric): Secondary | ICD-10-CM | POA: Diagnosis not present

## 2023-02-13 DIAGNOSIS — G4733 Obstructive sleep apnea (adult) (pediatric): Secondary | ICD-10-CM | POA: Diagnosis not present

## 2023-03-06 DIAGNOSIS — G4733 Obstructive sleep apnea (adult) (pediatric): Secondary | ICD-10-CM | POA: Diagnosis not present

## 2023-03-15 DIAGNOSIS — G4733 Obstructive sleep apnea (adult) (pediatric): Secondary | ICD-10-CM | POA: Diagnosis not present

## 2023-04-06 DIAGNOSIS — G4733 Obstructive sleep apnea (adult) (pediatric): Secondary | ICD-10-CM | POA: Diagnosis not present

## 2023-04-15 DIAGNOSIS — G4733 Obstructive sleep apnea (adult) (pediatric): Secondary | ICD-10-CM | POA: Diagnosis not present

## 2023-05-12 DIAGNOSIS — Z86010 Personal history of colonic polyps: Secondary | ICD-10-CM | POA: Diagnosis not present

## 2023-05-12 DIAGNOSIS — D124 Benign neoplasm of descending colon: Secondary | ICD-10-CM | POA: Diagnosis not present

## 2023-05-12 DIAGNOSIS — Z09 Encounter for follow-up examination after completed treatment for conditions other than malignant neoplasm: Secondary | ICD-10-CM | POA: Diagnosis not present

## 2023-05-15 DIAGNOSIS — G4733 Obstructive sleep apnea (adult) (pediatric): Secondary | ICD-10-CM | POA: Diagnosis not present

## 2023-05-19 DIAGNOSIS — M549 Dorsalgia, unspecified: Secondary | ICD-10-CM | POA: Diagnosis not present

## 2023-05-21 ENCOUNTER — Other Ambulatory Visit (HOSPITAL_BASED_OUTPATIENT_CLINIC_OR_DEPARTMENT_OTHER): Payer: Self-pay

## 2023-05-21 ENCOUNTER — Encounter (HOSPITAL_BASED_OUTPATIENT_CLINIC_OR_DEPARTMENT_OTHER): Payer: Self-pay

## 2023-05-21 ENCOUNTER — Emergency Department (HOSPITAL_BASED_OUTPATIENT_CLINIC_OR_DEPARTMENT_OTHER): Payer: Federal, State, Local not specified - PPO | Admitting: Radiology

## 2023-05-21 ENCOUNTER — Emergency Department (HOSPITAL_BASED_OUTPATIENT_CLINIC_OR_DEPARTMENT_OTHER)
Admission: EM | Admit: 2023-05-21 | Discharge: 2023-05-21 | Disposition: A | Discharge status: Home / Self Care | Payer: Federal, State, Local not specified - PPO | Attending: Emergency Medicine | Admitting: Emergency Medicine

## 2023-05-21 ENCOUNTER — Emergency Department (HOSPITAL_BASED_OUTPATIENT_CLINIC_OR_DEPARTMENT_OTHER): Payer: Federal, State, Local not specified - PPO

## 2023-05-21 ENCOUNTER — Other Ambulatory Visit: Payer: Self-pay

## 2023-05-21 DIAGNOSIS — M5412 Radiculopathy, cervical region: Secondary | ICD-10-CM | POA: Insufficient documentation

## 2023-05-21 DIAGNOSIS — I10 Essential (primary) hypertension: Secondary | ICD-10-CM | POA: Insufficient documentation

## 2023-05-21 DIAGNOSIS — E785 Hyperlipidemia, unspecified: Secondary | ICD-10-CM

## 2023-05-21 DIAGNOSIS — I6529 Occlusion and stenosis of unspecified carotid artery: Secondary | ICD-10-CM | POA: Diagnosis not present

## 2023-05-21 DIAGNOSIS — W010XXA Fall on same level from slipping, tripping and stumbling without subsequent striking against object, initial encounter: Secondary | ICD-10-CM | POA: Insufficient documentation

## 2023-05-21 DIAGNOSIS — M4802 Spinal stenosis, cervical region: Secondary | ICD-10-CM | POA: Diagnosis not present

## 2023-05-21 DIAGNOSIS — R202 Paresthesia of skin: Secondary | ICD-10-CM | POA: Diagnosis not present

## 2023-05-21 DIAGNOSIS — M5030 Other cervical disc degeneration, unspecified cervical region: Secondary | ICD-10-CM | POA: Diagnosis not present

## 2023-05-21 DIAGNOSIS — R0789 Other chest pain: Secondary | ICD-10-CM | POA: Diagnosis not present

## 2023-05-21 DIAGNOSIS — R079 Chest pain, unspecified: Secondary | ICD-10-CM | POA: Diagnosis not present

## 2023-05-21 DIAGNOSIS — M542 Cervicalgia: Secondary | ICD-10-CM | POA: Diagnosis not present

## 2023-05-21 HISTORY — DX: Hyperlipidemia, unspecified: E78.5

## 2023-05-21 LAB — CBC WITH DIFFERENTIAL/PLATELET
Abs Immature Granulocytes: 0.05 10*3/uL (ref 0.00–0.07)
Basophils Absolute: 0.1 10*3/uL (ref 0.0–0.1)
Basophils Relative: 1 %
Eosinophils Absolute: 0.2 10*3/uL (ref 0.0–0.5)
Eosinophils Relative: 2 %
HCT: 44.1 % (ref 39.0–52.0)
Hemoglobin: 14.4 g/dL (ref 13.0–17.0)
Immature Granulocytes: 1 %
Lymphocytes Relative: 20 %
Lymphs Abs: 1.8 10*3/uL (ref 0.7–4.0)
MCH: 26.9 pg (ref 26.0–34.0)
MCHC: 32.7 g/dL (ref 30.0–36.0)
MCV: 82.3 fL (ref 80.0–100.0)
Monocytes Absolute: 0.6 10*3/uL (ref 0.1–1.0)
Monocytes Relative: 6 %
Neutro Abs: 6.3 10*3/uL (ref 1.7–7.7)
Neutrophils Relative %: 70 %
Platelets: 285 10*3/uL (ref 150–400)
RBC: 5.36 MIL/uL (ref 4.22–5.81)
RDW: 13.7 % (ref 11.5–15.5)
WBC: 8.9 10*3/uL (ref 4.0–10.5)
nRBC: 0 % (ref 0.0–0.2)

## 2023-05-21 LAB — BASIC METABOLIC PANEL
Anion gap: 8 (ref 5–15)
BUN: 15 mg/dL (ref 6–20)
CO2: 25 mmol/L (ref 22–32)
Calcium: 9.1 mg/dL (ref 8.9–10.3)
Chloride: 106 mmol/L (ref 98–111)
Creatinine, Ser: 1.35 mg/dL — ABNORMAL HIGH (ref 0.61–1.24)
GFR, Estimated: >60 mL/min (ref >60)
Glucose, Bld: 101 mg/dL — ABNORMAL HIGH (ref 70–99)
Potassium: 4 mmol/L (ref 3.5–5.1)
Sodium: 139 mmol/L (ref 135–145)

## 2023-05-21 LAB — TROPONIN I (HIGH SENSITIVITY)
Troponin I (High Sensitivity): 4 ng/L (ref <18)
Troponin I (High Sensitivity): 4 ng/L (ref <18)

## 2023-05-21 MED ORDER — CYCLOBENZAPRINE HCL 10 MG PO TABS
10.0000 mg | ORAL_TABLET | Freq: Two times a day (BID) | ORAL | 0 refills | Status: DC | PRN
  Filled 2023-05-21: qty 20, 10d supply, fill #0

## 2023-05-21 MED ORDER — METHYLPREDNISOLONE 4 MG PO TBPK
ORAL_TABLET | ORAL | 0 refills | Status: DC
  Filled 2023-05-21: qty 21, 6d supply, fill #0

## 2023-05-21 MED ORDER — KETOROLAC TROMETHAMINE 30 MG/ML IJ SOLN
30.0000 mg | Freq: Once | INTRAMUSCULAR | Status: AC
  Administered 2023-05-21: 30 mg via INTRAVENOUS
  Filled 2023-05-21: qty 1

## 2023-05-21 MED ORDER — KETOROLAC TROMETHAMINE 60 MG/2ML IM SOLN
60.0000 mg | Freq: Once | INTRAMUSCULAR | Status: DC
Start: 2023-05-21 — End: 2023-05-21
  Filled 2023-05-21: qty 2

## 2023-05-21 MED ORDER — GABAPENTIN 100 MG PO CAPS
100.0000 mg | ORAL_CAPSULE | Freq: Three times a day (TID) | ORAL | 0 refills | Status: DC
  Filled 2023-05-21: qty 90, 30d supply, fill #0

## 2023-05-21 NOTE — ED Provider Notes (Signed)
[place] EMERGENCY DEPARTMENT AT [facility] Provider Note   CSN: [id number] Arrival date & time: 05/21/23  1012     History {Add pertinent medical, surgical, social history, OB history to HPI:1} No chief complaint on file.   Cody Wade is a 57 y.o. male.  HPI     57yo male with history of hyperlipidemia (NOT hypertension per patient) who presents with concern for left shoulder, neck and arm pain.  Reports he did have a slip and fall about 2 weeks ago.  On Tuesday, he developed left-sided neck pain.  Reports that it is a severe pain, 8 out of 10, with pain in the left neck, left shoulder with radiation down the left arm.  Describes it as a burning pain at times and an aching pain at other times.  He reports some numbness or tingling to the top part of his left arm and the back of his left arm.  Denies any numbness or tingling in his fingers.  Denies any weakness.  Denies any fevers, cancer history, or history of IV drug use.  Denies chest pain, shortness of breath, nausea, vomiting, diaphoresis.  Reports the pain is worse when he turns his head, particularly looking up, and is somewhat worse with moving his arms.  He did not have any trauma just prior to Tuesday, or any significant injuries or overuse that he can think of.  He started meloxicam but does not feel like he has had any relief.  Past Medical History:  Diagnosis Date   Allergic rhinitis    Hypertension      Home Medications Prior to Admission medications   Medication Sig Start Date End Date Taking? Authorizing Provider  atorvastatin (LIPITOR) 20 MG tablet 1 tablet    [provider]  azelastine (ASTELIN) 0.1 % nasal spray Place 1 spray into both nostrils 2 (two) times daily. Use in each nostril as directed 05/09/19   Kozlow, Alvira Philips, MD  EPINEPHrine 0.3 MG/0.3ML SOSY Use as directed for life-threatening allergic reaction. 05/09/19   Kozlow, Alvira Philips, MD  fexofenadine [facility]) 180 MG tablet Take  180 mg by mouth daily as needed for allergies or rhinitis.    [provider]  Flaxseed, Linseed, (FLAX SEED OIL PO) Take 1 tablet by mouth daily.    [provider]  fluticasone (FLONASE) 50 MCG/ACT nasal spray Place 1 spray into both nostrils 2 (two) times daily. 12/16/17   Kozlow, Alvira Philips, MD  hydrocortisone 1 % lotion Apply 1 application topically 2 (two) times daily. 05/09/19   Kozlow, Alvira Philips, MD  montelukast (SINGULAIR) 10 MG tablet TAKE 1 TABLET BY MOUTH EVERYDAY AT BEDTIME 05/09/19   Kozlow, Alvira Philips, MD  Multiple Vitamins-Minerals (MULTIVITAMIN PO) Take 1 tablet by mouth daily.    [provider]  Olopatadine HCl (PAZEO) 0.7 % SOLN Place 1 drop into both eyes 1 day or 1 dose. 12/07/17   Kozlow, Alvira Philips, MD  Omega-3 Fatty Acids (OMEGA 3 PO) Take 1 tablet by mouth daily.    [provider]  pantoprazole (PROTONIX) 40 MG tablet Take 1 tablet by mouth every morning.    [provider]  sertraline (ZOLOFT) 25 MG tablet Take 1 tablet (25 mg total) by mouth daily. 04/21/16   Linwood Dibbles, MD      Allergies    Patient has no known allergies.    Review of Systems   Review of Systems  Physical Exam Updated Vital Signs There were no vitals  taken for this visit. Physical Exam  ED Results / Procedures / Treatments   Labs (all labs ordered are listed, but only abnormal results are displayed) Labs Reviewed - No data to display  EKG None  Radiology No results found.  Procedures Procedures  {Document cardiac monitor, telemetry assessment procedure when appropriate:1}  Medications Ordered in ED Medications - No data to display  ED Course/ Medical Decision Making/ A&P   {   Click here for ABCD2, HEART and other calculatorsREFRESH Note before signing :1}                           57yo male with history of hyperlipidemia (NOT hypertension per patient) who presents with concern for left shoulder, neck and arm pain.  Differential diagnosis for left  neck, shoulder and arm pain includes ACS, aortic dissection, DVT, arterial thrombus, musculoskeletal/cervical radiculopathy.   Given his history of fall as well as his symptoms of tingling, ordered CT cervical spine for further evaluation which shows***.  EKG was completed and personally about interpreted by me shows no acute ST changes or signs of pericarditis.  X-ray was personally abided interpreted by me showing***no sign of mediastinal widening, pneumothorax, pneumonia.  Labs completed and personally about interpreted by me showed no clinically significant anemia, electrolyte abnormalities.  Troponin***within normal limits.  Have low clinical suspicion at this time for ACS.  Suspect possible cervical radiculopathy.  Do not see signs of need for emergent surgery, with no weakness, fevers.    {Document critical care time when appropriate:1} {Document review of labs and clinical decision tools ie heart score, Chads2Vasc2 etc:1}  {Document your independent review of radiology images, and any outside records:1} {Document your discussion with family members, caretakers, and with consultants:1} {Document social determinants of health affecting pt's care:1} {Document your decision making why or why not admission, treatments were needed:1} Final Clinical Impression(s) / ED Diagnoses Final diagnoses:  None    Rx / DC Orders ED Discharge Orders     None

## 2023-05-21 NOTE — ED Triage Notes (Signed)
Pt POV from home c/o left shoulder blade pain, radiating into left arm, occasional left arm numbness/tingling. Pain worse with ROM of neck.

## 2023-06-01 DIAGNOSIS — M4722 Other spondylosis with radiculopathy, cervical region: Secondary | ICD-10-CM | POA: Diagnosis not present

## 2023-06-01 DIAGNOSIS — Z6833 Body mass index (BMI) 33.0-33.9, adult: Secondary | ICD-10-CM | POA: Diagnosis not present

## 2023-06-08 DIAGNOSIS — M5021 Other cervical disc displacement,  high cervical region: Secondary | ICD-10-CM | POA: Diagnosis not present

## 2023-06-08 DIAGNOSIS — M4722 Other spondylosis with radiculopathy, cervical region: Secondary | ICD-10-CM | POA: Diagnosis not present

## 2023-06-08 DIAGNOSIS — M50322 Other cervical disc degeneration at C5-C6 level: Secondary | ICD-10-CM | POA: Diagnosis not present

## 2023-06-08 DIAGNOSIS — M4802 Spinal stenosis, cervical region: Secondary | ICD-10-CM | POA: Diagnosis not present

## 2023-06-08 DIAGNOSIS — M50223 Other cervical disc displacement at C6-C7 level: Secondary | ICD-10-CM | POA: Diagnosis not present

## 2023-06-14 DIAGNOSIS — M4722 Other spondylosis with radiculopathy, cervical region: Secondary | ICD-10-CM | POA: Diagnosis not present

## 2023-06-14 DIAGNOSIS — Z6833 Body mass index (BMI) 33.0-33.9, adult: Secondary | ICD-10-CM | POA: Diagnosis not present

## 2023-06-15 ENCOUNTER — Ambulatory Visit: Payer: Federal, State, Local not specified - PPO | Attending: Neurosurgery

## 2023-06-15 ENCOUNTER — Other Ambulatory Visit: Payer: Self-pay

## 2023-06-15 DIAGNOSIS — G4733 Obstructive sleep apnea (adult) (pediatric): Secondary | ICD-10-CM | POA: Diagnosis not present

## 2023-06-15 DIAGNOSIS — R252 Cramp and spasm: Secondary | ICD-10-CM | POA: Diagnosis not present

## 2023-06-15 DIAGNOSIS — M6281 Muscle weakness (generalized): Secondary | ICD-10-CM | POA: Insufficient documentation

## 2023-06-15 DIAGNOSIS — M542 Cervicalgia: Secondary | ICD-10-CM | POA: Diagnosis not present

## 2023-06-15 NOTE — Therapy (Signed)
OUTPATIENT PHYSICAL THERAPY CERVICAL EVALUATION   Patient Name: Cody Wade MRN: [id number] DOB:[birth year], 57 y.o., male Today's Date: 06/15/2023  END OF SESSION:  PT End of Session - 06/15/23 1505     Visit Number 1    Date for PT Re-Evaluation 08/10/23    Authorization Type BCBS    PT Start Time 1456    PT Stop Time 1538    PT Time Calculation (min) 42 min    Activity Tolerance Patient tolerated treatment well    Behavior During Therapy WFL for tasks assessed/performed             Past Medical History:  Diagnosis Date   Allergic rhinitis    Hyperlipidemia    Hypertension    Past Surgical History:  Procedure Laterality Date   SEPTOPLASTY Bilateral 07/2016   SINOSCOPY     Patient Active Problem List   Diagnosis Date Noted   Hyperlipidemia     PCP: Sigmund Hazel, MD   REFERRING PROVIDER: Coletta Memos, MD  REFERRING DIAG: [phone] (ICD-10-CM) - Other spondylosis with radiculopathy, cervical region  THERAPY DIAG:  Cervicalgia - Plan: PT plan of care cert/re-cert  Cramp and spasm - Plan: PT plan of care cert/re-cert  Muscle weakness (generalized) - Plan: PT plan of care cert/re-cert  Rationale for Evaluation and Treatment: Rehabilitation  ONSET DATE: 06/15/2023  SUBJECTIVE:                                                                                                                                                                                                         SUBJECTIVE STATEMENT: Patient reports several weeks of neck pain that is now causing some radiation into the left UE.  He also reports history of right shoulder issues and pain.  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  He hopes to eliminate the pain and be able to sleep and do daily activities without exacerbation of symptoms.    Hand dominance: Right  PERTINENT HISTORY:  na  PAIN:  Are you having pain?  Currently  1-2, with certain positions, can get up to 8-9/10  PRECAUTIONS: None  RED FLAGS: None     WEIGHT BEARING RESTRICTIONS: No  FALLS:  Has patient fallen in last 6 months? 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     NEXT MD VISIT: prn  OBJECTIVE:   DIAGNOSTIC FINDINGS:  na  PATIENT SURVEYS:  NDI score on paper- add to chart  COGNITION: Overall cognitive status: Within functional limits for tasks assessed  SENSATION: WFL  POSTURE: rounded shoulders and forward head  PALPATION: Tight bands and trigger points R > L upper traps and levator scap, tender at suboccipital areas bilaterally   CERVICAL ROM:   Active ROM A/PROM (deg) eval  Flexion WNL  Extension WNL  Right lateral flexion WFL  Left lateral flexion WFL  Right rotation 75%  Left rotation WFL   (Blank rows = not tested)  UPPER EXTREMITY ROM:  WFL both shoulders tight in posterior capsule , tightness in IR bilaterally UPPER EXTREMITY MMT:  Generally 5/5 throughout   CERVICAL SPECIAL TESTS:  Spurling's test: Negative  TODAY'S TREATMENT:                                                                                                                              DATE: 06/15/23 Initial eval completed and initiated HEP   PATIENT EDUCATION:  Education details: Initiated HEP Person educated: Patient Education method: Programmer, multimedia, Facilities manager, Verbal cues, and Handouts Education comprehension: verbalized  understanding, returned demonstration, and verbal cues required  HOME EXERCISE PROGRAM: Access Code: [email or web address] URL: [email or web address] Date: 06/15/2023 Prepared by: Mikey Kirschner  Exercises - Standing Cervical Retraction  - 3-4 x daily - 7 x weekly - 1 sets - 5 reps - 5-10second hold - Standing Cervical Rotation AROM  - 3-4 x daily - 7 x weekly - 1 sets - 5 reps - 5-10seconds hold - Standing Cervical Sidebending AROM  - 3-4 x daily - 7 x weekly - 1 sets - 5 reps - 5-10second hold - Seated Cervical Sidebending Stretch  - 2 x daily - 7 x weekly - 1 sets - 3 reps - 20-30 seconds hold - Seated Scapular Retraction  - 1 x daily - 7 x weekly - 1 sets - 20 reps  ASSESSMENT:  CLINICAL IMPRESSION: Patient is a 57 y.o. male who was seen today for physical therapy evaluation and treatment for neck and shoulder pain.  He presents with decreased C spine ROM, bilateral shoulder IR tightness with posterior capsule restriction.  Tight bands and trigger points located bilateral upper trap and levators R > L.  He is tender at suboccipital areas.  He has hx of right shoulder pain.   Symptoms consistent with possible right shoulder rotator cuff deficiency along with degenerative disc areas in Cspine.  He would benefit from C spine ROM, postural and shoulder strengthening and modalities for pain control including dry needling.    OBJECTIVE IMPAIRMENTS: decreased ROM, decreased strength, increased fascial restrictions, increased muscle spasms,  impaired UE functional use, postural dysfunction, and pain.   ACTIVITY LIMITATIONS: carrying, lifting, and reach over head  PARTICIPATION LIMITATIONS: meal prep, cleaning, laundry, driving, and yard work  PERSONAL FACTORS: Fitness are also affecting patient's functional outcome.   REHAB POTENTIAL: Good  CLINICAL DECISION MAKING: Stable/uncomplicated  EVALUATION COMPLEXITY: Low   GOALS: Goals reviewed with patient? Yes  SHORT TERM GOALS: Target  date: 07/13/2023   Patient will be independent with initial HEP  Baseline:  Goal status: INITIAL  2.  Patient to report pain no greater than 2/10  Baseline:  Goal status: INITIAL  LONG TERM GOALS: Target date: 08/10/2023   Patient to be independent with advanced HEP  Baseline:  Goal status: INITIAL  2.  Patient to report pain no greater than 2/10  Baseline:  Goal status: INITIAL  3.  Patient to be able to sleep through the night  Baseline:  Goal status: INITIAL  4.  NPDI to improve by 3-5 points Baseline:  Goal status: INITIAL  5.  Patient to report 85% improvement in overall symptoms  Baseline:  Goal status: INITIAL  6.  Centralize/eliminate UE pain  Baseline:  Goal status: INITIAL   PLAN:  PT FREQUENCY: 1-2x/week  PT DURATION: 8 weeks  PLANNED INTERVENTIONS: Therapeutic exercises, Therapeutic activity, Neuromuscular re-education, Patient/Family education, Self Care, Joint mobilization, Dry Needling, Electrical stimulation, Spinal mobilization, Cryotherapy, Moist heat, scar mobilization, Splintting, Taping, Vasopneumatic device, Traction, Ultrasound, Ionotophoresis 4mg /ml Dexamethasone, Manual therapy, and Re-evaluation  PLAN FOR NEXT SESSION: Review HEP, UBE, add tband postural strengthening, prone shoulder/scapular stabilization, consider DN to upper traps, levator, cervical multifidi and sub occipital areas,  ice post treatment   Michell Kader B. Vannessa Godown, PT 06/15/23 9:42 PM [facility] [address] [place], [place] [address] Phone # [phone] Fax [phone]

## 2023-06-29 ENCOUNTER — Ambulatory Visit: Payer: Federal, State, Local not specified - PPO | Attending: Neurosurgery | Admitting: Physical Therapy

## 2023-06-29 ENCOUNTER — Encounter: Payer: Self-pay | Admitting: Physical Therapy

## 2023-06-29 DIAGNOSIS — M542 Cervicalgia: Secondary | ICD-10-CM | POA: Diagnosis not present

## 2023-06-29 DIAGNOSIS — M6281 Muscle weakness (generalized): Secondary | ICD-10-CM | POA: Diagnosis not present

## 2023-06-29 DIAGNOSIS — R252 Cramp and spasm: Secondary | ICD-10-CM | POA: Insufficient documentation

## 2023-06-29 NOTE — Therapy (Addendum)
OUTPATIENT PHYSICAL THERAPY CERVICAL TREATMENT   Patient Name: Cody Wade MRN: [id number] DOB:May 06, 1966, 57 y.o., male Today's Date: 06/29/2023  END OF SESSION:  PT End of Session - 06/29/23 1104     Visit Number 2    Date for PT Re-Evaluation 08/10/23    Authorization Type BCBS    PT Start Time 1104    PT Stop Time 1145    PT Time Calculation (min) 41 min    Activity Tolerance Patient tolerated treatment well    Behavior During Therapy WFL for tasks assessed/performed              Past Medical History:  Diagnosis Date   Allergic rhinitis    Hyperlipidemia    Hypertension    Past Surgical History:  Procedure Laterality Date   SEPTOPLASTY Bilateral 07/2016   SINOSCOPY     Patient Active Problem List   Diagnosis Date Noted   Hyperlipidemia     PCP: Sigmund Hazel, MD   REFERRING PROVIDER: Coletta Memos, MD  REFERRING DIAG: [phone] (ICD-10-CM) - Other spondylosis with radiculopathy, cervical region  THERAPY DIAG:  Cervicalgia  Cramp and spasm  Muscle weakness (generalized)  Rationale for Evaluation and Treatment: Rehabilitation  ONSET DATE: 06/15/2023  SUBJECTIVE:                                                                                                                                                                                                         SUBJECTIVE STATEMENT: The HEP is helping.  I'm still getting pain into Lt UE.    Hand dominance: Right  PERTINENT HISTORY:  na  PAIN:  Are you having pain?  Currently 1-2, with certain positions, can get up to 8-9/10  PRECAUTIONS: None  RED FLAGS: None     WEIGHT BEARING RESTRICTIONS: No  FALLS:  Has patient fallen in last 6 months? 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     NEXT MD VISIT: prn  OBJECTIVE:   DIAGNOSTIC FINDINGS:  na  PATIENT SURVEYS:  NDI 34% self disability score  COGNITION: Overall cognitive status: Within functional limits for tasks assessed  SENSATION: [facility]  POSTURE: rounded shoulders and forward head  PALPATION: Tight bands and trigger points R > L upper traps and levator scap, tender at suboccipital areas bilaterally   CERVICAL ROM:   Active ROM A/PROM (deg) eval  Flexion WNL  Extension WNL  Right lateral flexion WFL  Left lateral flexion WFL  Right rotation 75%  Left rotation WFL   (Blank rows = not tested)  UPPER EXTREMITY ROM:  WFL both shoulders tight in posterior capsule , tightness in IR bilaterally UPPER EXTREMITY MMT:  Generally 5/5 throughout   CERVICAL SPECIAL TESTS:  Spurling's test: Negative  TODAY'S TREATMENT:                                                                                                                              DATE:  06/29/23: Standing neck retraction 5x5" Standing neck rotation bil Rt/Lt in retraction x 5 Standing neck SB A/ROM x5 bil Standing neck SB stretch 30" bil Standing scapular retraction 5x5" Trigger Point Dry-Needling  Treatment instructions: Expect mild to moderate muscle soreness. S/S of pneumothorax if dry needled over a lung field, and to seek immediate medical attention should they occur. Patient verbalized understanding of these instructions and education.  Patient Consent Given: Yes Education handout provided: Previously provided Muscles treated: bil upper trap, Lt SO, Lt cervical multifidi C5-C7 Electrical stimulation performed: No Parameters: N/A Treatment  response/outcome: signif twitch and release, elongation and improved resting tone Prone manual therapy: upper thoracic extension mobs and upper rib springing bil Gr II/III/IV Supine manual therapy: P/ROM cervical stretching end range bil rot, Lt U-joint sideglides with movement for Lt SB  Pt educ: DN aftercare instructions  06/15/23 Initial eval completed and initiated HEP   PATIENT EDUCATION:  Education details: Initiated HEP Person educated: Patient Education method: Programmer, multimedia, Facilities manager, Verbal cues, and Handouts Education comprehension: verbalized understanding, returned demonstration, and verbal cues required  HOME EXERCISE PROGRAM: Access Code: [email or web address] URL: [email or web address] Date: 06/15/2023 Prepared by: Mikey Kirschner  Exercises - Standing Cervical Retraction  - 3-4 x daily - 7 x weekly - 1 sets - 5 reps - 5-10second hold - Standing Cervical Rotation AROM  - 3-4 x daily - 7 x weekly - 1 sets - 5 reps - 5-10seconds hold - Standing Cervical Sidebending AROM  - 3-4 x daily - 7 x weekly - 1 sets - 5 reps - 5-10second hold - Seated Cervical Sidebending Stretch  - 2 x daily - 7 x weekly - 1 sets - 3 reps - 20-30 seconds hold - Seated Scapular Retraction  - 1 x daily - 7 x weekly - 1 sets - 20 reps  ASSESSMENT:  CLINICAL IMPRESSION: Pt has been compliant with HEP and reports some improvement in symptoms.  He was receptive to trying DN today with signif twitch, release and elongation of cervical soft tissues.  He will be traveling for work so is unsure when he will be able to return for another visit but was  very optimistic end of session by reduced muscular pain and tightness with more ease of ROM.  OBJECTIVE IMPAIRMENTS: decreased ROM, decreased strength, increased fascial restrictions, increased muscle spasms, impaired UE functional use, postural dysfunction, and pain.   ACTIVITY LIMITATIONS: carrying, lifting, and reach over head  PARTICIPATION  LIMITATIONS: meal prep, cleaning, laundry, driving, and yard work  PERSONAL FACTORS: Fitness are also affecting patient's functional outcome.   REHAB POTENTIAL: Good  CLINICAL DECISION MAKING: Stable/uncomplicated  EVALUATION COMPLEXITY: Low   GOALS: Goals reviewed with patient? Yes  SHORT TERM GOALS: Target date: 07/13/2023   Patient will be independent with initial HEP  Baseline:  Goal status: INITIAL  2.  Patient to report pain no greater than 2/10  Baseline:  Goal status: INITIAL  LONG TERM GOALS: Target date: 08/10/2023   Patient to be independent with advanced HEP  Baseline:  Goal status: INITIAL  2.  Patient to report pain no greater than 2/10  Baseline:  Goal status: INITIAL  3.  Patient to be able to sleep through the night  Baseline:  Goal status: INITIAL  4.  NPDI to improve by 3-5 points Baseline:  Goal status: INITIAL  5.  Patient to report 85% improvement in overall symptoms  Baseline:  Goal status: INITIAL  6.  Centralize/eliminate UE pain  Baseline:  Goal status: INITIAL   PLAN:  PT FREQUENCY: 1-2x/week  PT DURATION: 8 weeks  PLANNED INTERVENTIONS: Therapeutic exercises, Therapeutic activity, Neuromuscular re-education, Patient/Family education, Self Care, Joint mobilization, Dry Needling, Electrical stimulation, Spinal mobilization, Cryotherapy, Moist heat, scar mobilization, Splintting, Taping, Vasopneumatic device, Traction, Ultrasound, Ionotophoresis 4mg /ml Dexamethasone, Manual therapy, and Re-evaluation  PLAN FOR NEXT SESSION: f/u on DN #1, Review HEP, UBE, add tband postural strengthening, prone shoulder/scapular stabilization, heat or ice post treatment   Ison Wichmann, PT 06/29/23 1:30 PM  PHYSICAL THERAPY DISCHARGE SUMMARY  Visits from Start of Care: 2  Current functional level related to goals / functional outcomes: See above for most current PT status.  Pt didn't return to PT.     Remaining deficits: See above    Education / Equipment: HEP   Patient agrees to discharge. Patient goals were not met. Patient is being discharged due to not returning since the last visit.  Lorrene Reid, PT 11/25/23 4:44 PM    [facility] [address], [address] [place], [place] [address] Phone # [phone] Fax [phone]

## 2023-07-01 ENCOUNTER — Ambulatory Visit: Payer: Federal, State, Local not specified - PPO | Admitting: Physical Therapy

## 2023-07-13 ENCOUNTER — Ambulatory Visit: Payer: Federal, State, Local not specified - PPO | Admitting: Rehabilitative and Restorative Service Providers"

## 2023-07-14 DIAGNOSIS — Z125 Encounter for screening for malignant neoplasm of prostate: Secondary | ICD-10-CM | POA: Diagnosis not present

## 2023-07-14 DIAGNOSIS — F419 Anxiety disorder, unspecified: Secondary | ICD-10-CM | POA: Diagnosis not present

## 2023-07-14 DIAGNOSIS — Z23 Encounter for immunization: Secondary | ICD-10-CM | POA: Diagnosis not present

## 2023-07-14 DIAGNOSIS — Z86010 Personal history of colonic polyps: Secondary | ICD-10-CM | POA: Diagnosis not present

## 2023-07-14 DIAGNOSIS — G4733 Obstructive sleep apnea (adult) (pediatric): Secondary | ICD-10-CM | POA: Diagnosis not present

## 2023-07-14 DIAGNOSIS — R7303 Prediabetes: Secondary | ICD-10-CM | POA: Diagnosis not present

## 2023-07-14 DIAGNOSIS — E7800 Pure hypercholesterolemia, unspecified: Secondary | ICD-10-CM | POA: Diagnosis not present

## 2023-08-18 DIAGNOSIS — G4733 Obstructive sleep apnea (adult) (pediatric): Secondary | ICD-10-CM | POA: Diagnosis not present

## 2023-09-18 DIAGNOSIS — G4733 Obstructive sleep apnea (adult) (pediatric): Secondary | ICD-10-CM | POA: Diagnosis not present

## 2023-10-03 DIAGNOSIS — J019 Acute sinusitis, unspecified: Secondary | ICD-10-CM | POA: Diagnosis not present

## 2023-10-03 DIAGNOSIS — R051 Acute cough: Secondary | ICD-10-CM | POA: Diagnosis not present

## 2023-10-03 DIAGNOSIS — R0981 Nasal congestion: Secondary | ICD-10-CM | POA: Diagnosis not present

## 2023-10-03 DIAGNOSIS — U071 COVID-19: Secondary | ICD-10-CM | POA: Diagnosis not present

## 2023-10-18 DIAGNOSIS — G4733 Obstructive sleep apnea (adult) (pediatric): Secondary | ICD-10-CM | POA: Diagnosis not present

## 2023-10-22 ENCOUNTER — Other Ambulatory Visit: Payer: Self-pay

## 2023-10-22 ENCOUNTER — Encounter (HOSPITAL_BASED_OUTPATIENT_CLINIC_OR_DEPARTMENT_OTHER): Payer: Self-pay | Admitting: Emergency Medicine

## 2023-10-22 ENCOUNTER — Emergency Department (HOSPITAL_BASED_OUTPATIENT_CLINIC_OR_DEPARTMENT_OTHER)
Admission: EM | Admit: 2023-10-22 | Discharge: 2023-10-22 | Disposition: A | Discharge status: Home / Self Care | Payer: Federal, State, Local not specified - PPO | Attending: Emergency Medicine | Admitting: Emergency Medicine

## 2023-10-22 DIAGNOSIS — R319 Hematuria, unspecified: Secondary | ICD-10-CM | POA: Insufficient documentation

## 2023-10-22 LAB — CBC
HCT: 46.4 % (ref 39.0–52.0)
Hemoglobin: 14.9 g/dL (ref 13.0–17.0)
MCH: 26.9 pg (ref 26.0–34.0)
MCHC: 32.1 g/dL (ref 30.0–36.0)
MCV: 83.8 fL (ref 80.0–100.0)
Platelets: 257 10*3/uL (ref 150–400)
RBC: 5.54 MIL/uL (ref 4.22–5.81)
RDW: 13.3 % (ref 11.5–15.5)
WBC: 9.2 10*3/uL (ref 4.0–10.5)
nRBC: 0 % (ref 0.0–0.2)

## 2023-10-22 LAB — COMPREHENSIVE METABOLIC PANEL
ALT: 25 U/L (ref 0–44)
AST: 22 U/L (ref 15–41)
Albumin: 4.2 g/dL (ref 3.5–5.0)
Alkaline Phosphatase: 50 U/L (ref 38–126)
Anion gap: 8 (ref 5–15)
BUN: 13 mg/dL (ref 6–20)
CO2: 26 mmol/L (ref 22–32)
Calcium: 9.3 mg/dL (ref 8.9–10.3)
Chloride: 104 mmol/L (ref 98–111)
Creatinine, Ser: 1.28 mg/dL — ABNORMAL HIGH (ref 0.61–1.24)
GFR, Estimated: >60 mL/min (ref >60)
Glucose, Bld: 98 mg/dL (ref 70–99)
Potassium: 3.9 mmol/L (ref 3.5–5.1)
Sodium: 138 mmol/L (ref 135–145)
Total Bilirubin: 0.4 mg/dL (ref <1.2)
Total Protein: 6.9 g/dL (ref 6.5–8.1)

## 2023-10-22 LAB — URINALYSIS, ROUTINE W REFLEX MICROSCOPIC
Bilirubin Urine: NEGATIVE
Glucose, UA: NEGATIVE mg/dL
Hgb urine dipstick: NEGATIVE
Ketones, ur: NEGATIVE mg/dL
Leukocytes,Ua: NEGATIVE
Nitrite: NEGATIVE
Protein, ur: NEGATIVE mg/dL
Specific Gravity, Urine: 1.014 (ref 1.005–1.030)
pH: 5.5 (ref 5.0–8.0)

## 2023-10-22 NOTE — ED Triage Notes (Signed)
C/o blood in urine starting last night, Denies pain. No hx of same.

## 2023-10-22 NOTE — Discharge Instructions (Addendum)
Evaluation today was overall reassuring.  Your labs today were normal.  Your urinalysis did not indicate that you have blood in your urine.  However I do recommend that you do go to your follow-up appoint with urology and follow-up with your PCP.  If you develop any abdominal pain, flank tenderness, fever, worsening bloody urine or bloody stools or any other concerning symptom please return emergency department further evaluation.

## 2023-10-22 NOTE — ED Provider Notes (Signed)
[place] EMERGENCY DEPARTMENT AT [facility] Provider Note   CSN: [id number] Arrival date & time: 10/22/23  1120     History  Chief Complaint  Patient presents with   Hematuria   HPI Cody Wade is a 57 y.o. male presenting for hematuria.  Noticed blood in his urine last night.  Denies abdominal pain or flank tenderness.  Denies fever and chills.  Denies rectal bleeding or bloody stools.  Denies pain with defecation.  Did report that he has follow-up with urology in a few weeks.   Hematuria       Home Medications Prior to Admission medications   Medication Sig Start Date End Date Taking? Authorizing Provider  atorvastatin (LIPITOR) 20 MG tablet 1 tablet    [provider]  azelastine (ASTELIN) 0.1 % nasal spray Place 1 spray into both nostrils 2 (two) times daily. Use in each nostril as directed 05/09/19   Kozlow, Alvira Philips, MD  cyclobenzaprine (FLEXERIL) 10 MG tablet Take 1 tablet (10 mg total) by mouth 2 (two) times daily as needed for muscle spasms. 05/21/23   Alvira Monday, MD  EPINEPHrine 0.3 MG/0.3ML SOSY Use as directed for life-threatening allergic reaction. 05/09/19   Kozlow, Alvira Philips, MD  fexofenadine [facility] ALLERGY) 180 MG tablet Take 180 mg by mouth daily as needed for allergies or rhinitis.    [provider]  Flaxseed, Linseed, (FLAX SEED OIL PO) Take 1 tablet by mouth daily.    [provider]  fluticasone (FLONASE) 50 MCG/ACT nasal spray Place 1 spray into both nostrils 2 (two) times daily. 12/16/17   Kozlow, Alvira Philips, MD  gabapentin (NEURONTIN) 100 MG capsule Take 1 capsule (100 mg total) by mouth 3 (three) times daily. 05/21/23 06/20/23  Alvira Monday, MD  hydrocortisone 1 % lotion Apply 1 application topically 2 (two) times daily. 05/09/19   Kozlow, Alvira Philips, MD  meloxicam (MOBIC) 15 MG tablet Take 15 mg by mouth daily. 05/19/23   [provider]  methylPREDNISolone (MEDROL DOSEPAK) 4 MG TBPK tablet See package  instructions 05/21/23   Alvira Monday, MD  montelukast (SINGULAIR) 10 MG tablet TAKE 1 TABLET BY MOUTH EVERYDAY AT BEDTIME 05/09/19   Kozlow, Alvira Philips, MD  Multiple Vitamins-Minerals (MULTIVITAMIN PO) Take 1 tablet by mouth daily.    [provider]  Olopatadine HCl (PAZEO) 0.7 % SOLN Place 1 drop into both eyes 1 day or 1 dose. 12/07/17   Kozlow, Alvira Philips, MD  Omega-3 Fatty Acids (OMEGA 3 PO) Take 1 tablet by mouth daily.    [provider]  pantoprazole (PROTONIX) 40 MG tablet Take 1 tablet by mouth every morning.    [provider]  sertraline (ZOLOFT) 25 MG tablet Take 1 tablet (25 mg total) by mouth daily. 04/21/16   Linwood Dibbles, MD      Allergies    Patient has no known allergies.    Review of Systems   Review of Systems  Genitourinary:  Positive for hematuria.    Physical Exam Updated Vital Signs BP (!) 144/81   Pulse 69   Temp (!) 97.3 F (36.3 C)   Resp 18   Ht 6\' 2"  (1.88 m)   Wt 117.9 kg   SpO2 99%   BMI 33.38 kg/m  Physical Exam Constitutional:      Appearance: Normal appearance.  HENT:     Head: Normocephalic.     Nose: Nose normal.  Eyes:     Conjunctiva/sclera: Conjunctivae normal.  Pulmonary:  Effort: Pulmonary effort is normal.  Neurological:     Mental Status: He is alert.  Psychiatric:        Mood and Affect: Mood normal.     ED Results / Procedures / Treatments   Labs (all labs ordered are listed, but only abnormal results are displayed) Labs Reviewed  COMPREHENSIVE METABOLIC PANEL - Abnormal; Notable for the following components:      Result Value   Creatinine, Ser 1.28 (*)    All other components within normal limits  URINALYSIS, ROUTINE W REFLEX MICROSCOPIC  CBC    EKG None  Radiology No results found.  Procedures Procedures    Medications Ordered in ED Medications - No data to display  ED Course/ Medical Decision Making/ A&P                                 Medical Decision Making Amount and/or  Complexity of Data Reviewed Labs: ordered.   57 year old well-appearing male presenting for hematuria.  Exam was unremarkable.  Workup reassuring.  Urinalysis negative for blood in the urine.  Discussed possibility of rectal bleeding with patient.  Patient did refuse DRE and was confident there was no blood in his stool he recently.  On a blood thinner.  Overall looks well, no acute distress and, hemodynamically stable with no other associated symptoms.   For this reason, felt that it was appropriate for him to discharge and follow-up with his PCP and go to his scheduled urology appointment in a couple of weeks.  Discussed precautions.  Vital stable.  Discharged in good condition.        Final Clinical Impression(s) / ED Diagnoses Final diagnoses:  Hematuria, unspecified type    Rx / DC Orders ED Discharge Orders     None         Gareth Eagle, PA-C 10/22/23 1449    Melene Plan, DO 10/22/23 [phone]

## 2023-11-08 DIAGNOSIS — G4733 Obstructive sleep apnea (adult) (pediatric): Secondary | ICD-10-CM | POA: Diagnosis not present

## 2023-11-26 DIAGNOSIS — R310 Gross hematuria: Secondary | ICD-10-CM | POA: Diagnosis not present

## 2023-11-26 DIAGNOSIS — N401 Enlarged prostate with lower urinary tract symptoms: Secondary | ICD-10-CM | POA: Diagnosis not present

## 2023-11-26 DIAGNOSIS — R3912 Poor urinary stream: Secondary | ICD-10-CM | POA: Diagnosis not present

## 2023-12-01 DIAGNOSIS — G4733 Obstructive sleep apnea (adult) (pediatric): Secondary | ICD-10-CM | POA: Diagnosis not present

## 2023-12-21 DIAGNOSIS — R310 Gross hematuria: Secondary | ICD-10-CM | POA: Diagnosis not present

## 2023-12-29 DIAGNOSIS — G4733 Obstructive sleep apnea (adult) (pediatric): Secondary | ICD-10-CM | POA: Diagnosis not present

## 2024-01-03 DIAGNOSIS — R7303 Prediabetes: Secondary | ICD-10-CM | POA: Diagnosis not present

## 2024-01-03 DIAGNOSIS — E7800 Pure hypercholesterolemia, unspecified: Secondary | ICD-10-CM | POA: Diagnosis not present

## 2024-01-05 DIAGNOSIS — N401 Enlarged prostate with lower urinary tract symptoms: Secondary | ICD-10-CM | POA: Diagnosis not present

## 2024-01-05 DIAGNOSIS — R310 Gross hematuria: Secondary | ICD-10-CM | POA: Diagnosis not present

## 2024-01-05 DIAGNOSIS — N3080 Other cystitis without hematuria: Secondary | ICD-10-CM | POA: Diagnosis not present

## 2024-01-05 DIAGNOSIS — R3912 Poor urinary stream: Secondary | ICD-10-CM | POA: Diagnosis not present

## 2024-01-06 ENCOUNTER — Other Ambulatory Visit: Payer: Self-pay | Admitting: Urology

## 2024-01-11 DIAGNOSIS — M25311 Other instability, right shoulder: Secondary | ICD-10-CM | POA: Diagnosis not present

## 2024-01-11 DIAGNOSIS — M75101 Unspecified rotator cuff tear or rupture of right shoulder, not specified as traumatic: Secondary | ICD-10-CM | POA: Diagnosis not present

## 2024-01-11 NOTE — Patient Instructions (Signed)
 SURGICAL WAITING ROOM VISITATION Patients having surgery or a procedure may have no more than 2 support people in the waiting area - these visitors may rotate in the visitor waiting room.   Due to an increase in RSV and influenza rates and associated hospitalizations, children ages 65 and under may not visit patients in [facility] hospitals. If the patient needs to stay at the hospital during part of their recovery, the visitor guidelines for inpatient rooms apply.  PRE-OP VISITATION  Pre-op nurse will coordinate an appropriate time for 1 support person to accompany the patient in pre-op.  This support person may not rotate.  This visitor will be contacted when the time is appropriate for the visitor to come back in the pre-op area.  Please refer to the [facility] website for the visitor guidelines for Inpatients (after your surgery is over and you are in a regular room).  You are not required to quarantine at this time prior to your surgery. However, you must do this: Hand Hygiene often Do NOT share personal items Notify your provider if you are in close contact with someone who has COVID or you develop fever 100.4 or greater, new onset of sneezing, cough, sore throat, shortness of breath or body aches.  If you test positive for Covid or have been in contact with anyone that has tested positive in the last 10 days please notify you surgeon.    Your procedure is scheduled on:  Tuesday  January 18, 2024  Report to [facility] Main Entrance: Leota Jacobsen entrance where the [employer] is available.   Report to admitting at:   12:00 Noon  Call this number if you have any questions or problems the morning of surgery [phone]  DO NOT EAT OR DRINK ANYTHING AFTER MIDNIGHT THE NIGHT PRIOR TO YOUR SURGERY / PROCEDURE.   FOLLOW  ANY ADDITIONAL PRE OP INSTRUCTIONS YOU RECEIVED FROM YOUR SURGEON'S OFFICE!!!   Oral Hygiene is also important to reduce your risk of infection.         Remember - BRUSH YOUR TEETH THE MORNING OF SURGERY WITH YOUR REGULAR TOOTHPASTE  Do NOT smoke after Midnight the night before surgery.  STOP TAKING all Vitamins, Herbs and supplements 1 week before your surgery.   Take ONLY these medicines the morning of surgery with A SIP OF WATER: Sertraline. You may use your Eye drops if needed.    If You have been diagnosed with Sleep Apnea - Bring CPAP mask and tubing day of surgery. We will provide you with a CPAP machine on the day of your surgery.                   You may not have any metal on your body including jewelry, and body piercing  Do not wear  lotions, powders, cologne, or deodorant  Men may shave face and neck.  Patients discharged on the day of surgery will not be allowed to drive home.  Someone NEEDS to stay with you for the first 24 hours after anesthesia.  Do not bring your home medications to the hospital. The Pharmacy will dispense medications listed on your medication list to you during your admission in the Hospital.  Please read over the following fact sheets you were given: IF YOU HAVE QUESTIONS ABOUT YOUR PRE-OP INSTRUCTIONS, PLEASE CALL [phone].   [place] - Preparing for Surgery Before surgery, you can play an important role.  Because skin is not sterile, your skin needs to  be as free of germs as possible.  You can reduce the number of germs on your skin by washing with CHG (chlorahexidine gluconate) soap before surgery.  CHG is an antiseptic cleaner which kills germs and bonds with the skin to continue killing germs even after washing. Please DO NOT use if you have an allergy to CHG or antibacterial soaps.  If your skin becomes reddened/irritated stop using the CHG and inform your nurse when you arrive at Short Stay. Do not shave (including legs and underarms) for at least 48 hours prior to the first CHG shower.  You may shave your face/neck.  Please follow these instructions carefully:  1.  Shower with CHG  Soap the night before surgery and the  morning of surgery.  2.  If you choose to wash your hair, wash your hair first as usual with your normal  shampoo.  3.  After you shampoo, rinse your hair and body thoroughly to remove the shampoo.                             4.  Use CHG as you would any other liquid soap.  You can apply chg directly to the skin and wash.  Gently with a scrungie or clean washcloth.  5.  Apply the CHG Soap to your body ONLY FROM THE NECK DOWN.   Do not use on face/ open                           Wound or open sores. Avoid contact with eyes, ears mouth and genitals (private parts).                       Wash face,  Genitals (private parts) with your normal soap.             6.  Wash thoroughly, paying special attention to the area where your  surgery  will be performed.  7.  Thoroughly rinse your body with warm water from the neck down.  8.  DO NOT shower/wash with your normal soap after using and rinsing off the CHG Soap.            9.  Pat yourself dry with a clean towel.            10.  Wear clean pajamas.            11.  Place clean sheets on your bed the night of your first shower and do not  sleep with pets.  ON THE DAY OF SURGERY : Do not apply any lotions/deodorants the morning of surgery.  Please wear clean clothes to the hospital/surgery center.    FAILURE TO FOLLOW THESE INSTRUCTIONS MAY RESULT IN THE CANCELLATION OF YOUR SURGERY  PATIENT SIGNATURE_________________________________  NURSE SIGNATURE__________________________________  ________________________________________________________________________

## 2024-01-11 NOTE — Progress Notes (Signed)
 COVID Vaccine received:  []  No [x]  Yes Date of any COVID positive Test in last 90 days: None  PCP - Sigmund Hazel, MD  Cardiologist - None  Chest x-ray - 05-21-2023   2v  Epic EKG -  05-21-2023  Epic Stress Test -  ECHO -  Cardiac Cath -   PCR screen: []  Ordered & Completed []   No Order but Needs PROFEND     [x]   N/A for this surgery  Surgery Plan:  [x]  Ambulatory   []  Outpatient in bed  []  Admit Anesthesia:    [x]  General  []  Spinal  []   Choice []   MAC  Pacemaker / ICD device  [x]  No []  Yes   Spinal Cord Stimulator: [x]  No []  Yes       History of Sleep Apnea? []  No [x]  Yes   CPAP used?- []  No [x]  Yes    Does the patient monitor blood sugar?   []  N/A   [x]  No []  Yes  Patient has: []  NO Hx DM   [x]  Pre-DM   []  DM1  []   DM2 Last A1c was:  5.9 on   10-07-2020    Blood Thinner / Instructions:  none Aspirin Instructions:  none  ERAS Protocol Ordered: [x]  No  []  Yes Patient is to be NPO after: midnight prior  Dental hx: []  Dentures:  [x]  N/A      []  Bridge or Partial:                   []  Loose or Damaged teeth:   Activity level: Patient is able to climb a flight of stairs without difficulty; [x]  No CP  [x]  No SOB. Patient can perform ADLs without assistance.   Anesthesia review: HTN, Pre-DM- no meds, OSA-CPAP (pt doesn't want to bring because he's outpatient).  Patient denies shortness of breath, fever, cough and chest pain at PAT appointment.  Patient verbalized understanding and agreement to the Pre-Surgical Instructions that were given to them at this PAT appointment. Patient was also educated of the need to review these PAT instructions again prior to his surgery.I reviewed the appropriate phone numbers to call if they have any and questions or concerns.

## 2024-01-12 ENCOUNTER — Other Ambulatory Visit: Payer: Self-pay

## 2024-01-12 ENCOUNTER — Encounter (HOSPITAL_COMMUNITY): Payer: Self-pay

## 2024-01-12 ENCOUNTER — Encounter (HOSPITAL_COMMUNITY)
Admission: RE | Admit: 2024-01-12 | Discharge: 2024-01-12 | Disposition: A | Discharge status: Home / Self Care | Source: Ambulatory Visit | Attending: Urology | Admitting: Urology

## 2024-01-12 VITALS — BP 137/88 | HR 86 | Temp 98.6°F | Resp 16 | Ht 74.0 in | Wt 263.0 lb

## 2024-01-12 DIAGNOSIS — I10 Essential (primary) hypertension: Secondary | ICD-10-CM | POA: Diagnosis not present

## 2024-01-12 DIAGNOSIS — Z01812 Encounter for preprocedural laboratory examination: Secondary | ICD-10-CM | POA: Insufficient documentation

## 2024-01-12 DIAGNOSIS — Z01818 Encounter for other preprocedural examination: Secondary | ICD-10-CM

## 2024-01-12 HISTORY — DX: Pneumonia, unspecified organism: J18.9

## 2024-01-12 HISTORY — DX: Anxiety disorder, unspecified: F41.9

## 2024-01-12 HISTORY — DX: Sleep apnea, unspecified: G47.30

## 2024-01-12 HISTORY — DX: Prediabetes: R73.03

## 2024-01-12 LAB — BASIC METABOLIC PANEL
Anion gap: 12 (ref 5–15)
BUN: 19 mg/dL (ref 6–20)
CO2: 20 mmol/L — ABNORMAL LOW (ref 22–32)
Calcium: 9.4 mg/dL (ref 8.9–10.3)
Chloride: 103 mmol/L (ref 98–111)
Creatinine, Ser: 1.34 mg/dL — ABNORMAL HIGH (ref 0.61–1.24)
GFR, Estimated: >60 mL/min (ref >60)
Glucose, Bld: 90 mg/dL (ref 70–99)
Potassium: 4.3 mmol/L (ref 3.5–5.1)
Sodium: 135 mmol/L (ref 135–145)

## 2024-01-12 LAB — CBC
HCT: 49.9 % (ref 39.0–52.0)
Hemoglobin: 15.9 g/dL (ref 13.0–17.0)
MCH: 26.8 pg (ref 26.0–34.0)
MCHC: 31.9 g/dL (ref 30.0–36.0)
MCV: 84 fL (ref 80.0–100.0)
Platelets: 284 10*3/uL (ref 150–400)
RBC: 5.94 MIL/uL — ABNORMAL HIGH (ref 4.22–5.81)
RDW: 13.6 % (ref 11.5–15.5)
WBC: 10.7 10*3/uL — ABNORMAL HIGH (ref 4.0–10.5)
nRBC: 0 % (ref 0.0–0.2)

## 2024-01-13 DIAGNOSIS — M25311 Other instability, right shoulder: Secondary | ICD-10-CM | POA: Diagnosis not present

## 2024-01-13 DIAGNOSIS — M75101 Unspecified rotator cuff tear or rupture of right shoulder, not specified as traumatic: Secondary | ICD-10-CM | POA: Diagnosis not present

## 2024-01-18 ENCOUNTER — Encounter (HOSPITAL_COMMUNITY): Payer: Self-pay | Admitting: Urology

## 2024-01-18 ENCOUNTER — Other Ambulatory Visit: Payer: Self-pay

## 2024-01-18 ENCOUNTER — Ambulatory Visit (HOSPITAL_COMMUNITY): Admitting: Anesthesiology

## 2024-01-18 ENCOUNTER — Ambulatory Visit (HOSPITAL_COMMUNITY)
Admission: RE | Admit: 2024-01-18 | Discharge: 2024-01-18 | Disposition: A | Discharge status: Home / Self Care | Attending: Urology | Admitting: Urology

## 2024-01-18 ENCOUNTER — Encounter (HOSPITAL_COMMUNITY)
Admission: RE | Disposition: A | Discharge status: Home / Self Care | Payer: Self-pay | Source: Home / Self Care | Attending: Urology

## 2024-01-18 DIAGNOSIS — F419 Anxiety disorder, unspecified: Secondary | ICD-10-CM | POA: Diagnosis not present

## 2024-01-18 DIAGNOSIS — G4730 Sleep apnea, unspecified: Secondary | ICD-10-CM | POA: Insufficient documentation

## 2024-01-18 DIAGNOSIS — N401 Enlarged prostate with lower urinary tract symptoms: Secondary | ICD-10-CM | POA: Diagnosis not present

## 2024-01-18 DIAGNOSIS — I10 Essential (primary) hypertension: Secondary | ICD-10-CM | POA: Insufficient documentation

## 2024-01-18 DIAGNOSIS — C674 Malignant neoplasm of posterior wall of bladder: Secondary | ICD-10-CM | POA: Diagnosis not present

## 2024-01-18 DIAGNOSIS — R3912 Poor urinary stream: Secondary | ICD-10-CM | POA: Diagnosis not present

## 2024-01-18 DIAGNOSIS — E669 Obesity, unspecified: Secondary | ICD-10-CM | POA: Diagnosis not present

## 2024-01-18 DIAGNOSIS — N3289 Other specified disorders of bladder: Secondary | ICD-10-CM | POA: Diagnosis not present

## 2024-01-18 DIAGNOSIS — Z6833 Body mass index (BMI) 33.0-33.9, adult: Secondary | ICD-10-CM | POA: Insufficient documentation

## 2024-01-18 DIAGNOSIS — N3020 Other chronic cystitis without hematuria: Secondary | ICD-10-CM | POA: Diagnosis not present

## 2024-01-18 DIAGNOSIS — R310 Gross hematuria: Secondary | ICD-10-CM | POA: Insufficient documentation

## 2024-01-18 DIAGNOSIS — N329 Bladder disorder, unspecified: Secondary | ICD-10-CM | POA: Diagnosis not present

## 2024-01-18 SURGERY — CYSTOSCOPY, WITH BIOPSY
Anesthesia: General

## 2024-01-18 MED ORDER — LACTATED RINGERS IV SOLN: INTRAVENOUS | Status: DC

## 2024-01-18 MED ORDER — FENTANYL CITRATE (PF) 100 MCG/2ML IJ SOLN
INTRAMUSCULAR | Status: DC | PRN
  Administered 2024-01-18: 50 ug via INTRAVENOUS

## 2024-01-18 MED ORDER — ACETAMINOPHEN 500 MG PO TABS
1000.0000 mg | ORAL_TABLET | Freq: Once | ORAL | Status: AC
  Administered 2024-01-18: 1000 mg via ORAL
  Filled 2024-01-18: qty 2

## 2024-01-18 MED ORDER — DROPERIDOL 2.5 MG/ML IJ SOLN: 0.6250 mg | Freq: Once | INTRAMUSCULAR | Status: DC | PRN

## 2024-01-18 MED ORDER — DEXAMETHASONE SODIUM PHOSPHATE 10 MG/ML IJ SOLN
INTRAMUSCULAR | Status: DC | PRN
  Administered 2024-01-18: 10 mg via INTRAVENOUS

## 2024-01-18 MED ORDER — TRAMADOL HCL 50 MG PO TABS: 50.0000 mg | ORAL_TABLET | Freq: Four times a day (QID) | ORAL | 0 refills | Status: AC | PRN

## 2024-01-18 MED ORDER — MIDAZOLAM HCL 5 MG/5ML IJ SOLN
INTRAMUSCULAR | Status: DC | PRN
  Administered 2024-01-18: 2 mg via INTRAVENOUS

## 2024-01-18 MED ORDER — OXYCODONE HCL 5 MG PO TABS: 5.0000 mg | ORAL_TABLET | Freq: Once | ORAL | Status: DC | PRN

## 2024-01-18 MED ORDER — OXYCODONE HCL 5 MG/5ML PO SOLN: 5.0000 mg | Freq: Once | ORAL | Status: DC | PRN

## 2024-01-18 MED ORDER — CHLORHEXIDINE GLUCONATE 0.12 % MT SOLN
15.0000 mL | Freq: Once | OROMUCOSAL | Status: AC
  Administered 2024-01-18: 15 mL via OROMUCOSAL

## 2024-01-18 MED ORDER — PROPOFOL 10 MG/ML IV BOLUS
INTRAVENOUS | Status: DC | PRN
  Administered 2024-01-18: 200 mg via INTRAVENOUS

## 2024-01-18 MED ORDER — STERILE WATER FOR IRRIGATION IR SOLN
Status: DC | PRN
  Administered 2024-01-18: 3000 mL

## 2024-01-18 MED ORDER — FENTANYL CITRATE PF 50 MCG/ML IJ SOSY: 25.0000 ug | PREFILLED_SYRINGE | INTRAMUSCULAR | Status: DC | PRN

## 2024-01-18 MED ORDER — ORAL CARE MOUTH RINSE: 15.0000 mL | Freq: Once | OROMUCOSAL | Status: AC

## 2024-01-18 MED ORDER — LIDOCAINE HCL (PF) 2 % IJ SOLN
INTRAMUSCULAR | Status: DC | PRN
  Administered 2024-01-18: 100 mg via INTRADERMAL

## 2024-01-18 MED ORDER — CEFAZOLIN SODIUM-DEXTROSE 2-4 GM/100ML-% IV SOLN
2.0000 g | INTRAVENOUS | Status: AC
  Administered 2024-01-18: 2 g via INTRAVENOUS
  Filled 2024-01-18: qty 100

## 2024-01-18 MED ORDER — ONDANSETRON HCL 4 MG/2ML IJ SOLN
INTRAMUSCULAR | Status: DC | PRN
  Administered 2024-01-18: 4 mg via INTRAVENOUS

## 2024-01-18 SURGICAL SUPPLY — 18 items
BAG URINE DRAIN 2000ML AR STRL (UROLOGICAL SUPPLIES) IMPLANT
BAG URO CATCHER STRL LF (MISCELLANEOUS) ×1 IMPLANT
CLOTH BEACON ORANGE TIMEOUT ST (SAFETY) ×1 IMPLANT
DRAPE FOOT SWITCH (DRAPES) ×1 IMPLANT
ELECT COAG BIPOLAR CYL 1.2MMM (ELECTROSURGICAL) IMPLANT
ELECT REM PT RETURN 15FT ADLT (MISCELLANEOUS) ×1 IMPLANT
ELECTRODE COAG BIPLR CYL 1.2MM (ELECTROSURGICAL) IMPLANT
GLOVE BIO SURGEON STRL SZ 6.5 (GLOVE) ×1 IMPLANT
GLOVE SURG LX STRL 7.5 STRW (GLOVE) ×1 IMPLANT
GOWN STRL REUS W/ TWL LRG LVL3 (GOWN DISPOSABLE) ×1 IMPLANT
KIT TURNOVER KIT A (KITS) IMPLANT
LOOP CUT BIPOLAR 24F LRG (ELECTROSURGICAL) IMPLANT
MANIFOLD NEPTUNE II (INSTRUMENTS) ×1 IMPLANT
PACK CYSTO (CUSTOM PROCEDURE TRAY) ×1 IMPLANT
SYR TOOMEY IRRIG 70ML (MISCELLANEOUS) IMPLANT
SYRINGE TOOMEY IRRIG 70ML (MISCELLANEOUS) IMPLANT
TUBING CONNECTING 10 (TUBING) ×1 IMPLANT
TUBING UROLOGY SET (TUBING) IMPLANT

## 2024-01-18 NOTE — Interval H&P Note (Signed)
 History and Physical Interval Note:  01/18/2024 1:38 PM  Cody Wade  has presented today for surgery, with the diagnosis of BLADDER LESION.  The various methods of treatment have been discussed with the patient and family. After consideration of risks, benefits and other options for treatment, the patient has consented to  Procedure(s) with comments: CYSTOSCOPY, WITH BIOPSY (N/A) - CYSTOSCOPY WITH BLADDER BIOPSY AND FULGURATION CYSTOSCOPY, WITH BLADDER FULGURATION (N/A) as a surgical intervention.  The patient's history has been reviewed, patient examined, no change in status, stable for surgery.  I have reviewed the patient's chart and labs.  Questions were answered to the patient's satisfaction.     [name] D Traivon Morrical

## 2024-01-18 NOTE — Anesthesia Preprocedure Evaluation (Addendum)
 Anesthesia Evaluation  Patient identified by MRN, date of birth, ID band Patient awake    Reviewed: Allergy & Precautions, NPO status , Patient's Chart, lab work & pertinent test results  Airway Mallampati: II  TM Distance: >3 FB Neck ROM: Full    Dental  (+) Dental Advisory Given, Teeth Intact   Pulmonary sleep apnea and Continuous Positive Airway Pressure Ventilation , pneumonia   Pulmonary exam normal breath sounds clear to auscultation       Cardiovascular hypertension, Pt. on medications Normal cardiovascular exam Rhythm:Regular Rate:Normal     Neuro/Psych   Anxiety     negative neurological ROS     GI/Hepatic negative GI ROS, Neg liver ROS,,,  Endo/Other  negative endocrine ROS    Renal/GU negative Renal ROS     Musculoskeletal negative musculoskeletal ROS (+)    Abdominal  (+) + obese  Peds  Hematology negative hematology ROS (+)   Anesthesia Other Findings   Reproductive/Obstetrics                             Anesthesia Physical Anesthesia Plan  ASA: 2  Anesthesia Plan: General   Post-op Pain Management: Tylenol PO (pre-op)*   Induction: Intravenous  PONV Risk Score and Plan: 3 and Ondansetron, Dexamethasone, Treatment may vary due to age or medical condition and Midazolam  Airway Management Planned: LMA and Oral ETT  Additional Equipment:   Intra-op Plan:   Post-operative Plan: Extubation in OR  Informed Consent: I have reviewed the patients History and Physical, chart, labs and discussed the procedure including the risks, benefits and alternatives for the proposed anesthesia with the patient or authorized representative who has indicated his/her understanding and acceptance.     Dental advisory given  Plan Discussed with: CRNA  Anesthesia Plan Comments:        Anesthesia Quick Evaluation

## 2024-01-18 NOTE — Discharge Instructions (Signed)
Post Bladder Surgery Instructions   General instructions:     Your recent bladder surgery requires very little post hospital care but some definite precautions.  Despite the fact that no skin incisions were used, the area around the bladder incisions are raw and covered with scabs to promote healing and prevent bleeding. Certain precautions are needed to insure that the scabs are not disturbed over the next 2-4 weeks while the healing proceeds.  Because the raw surface inside your bladder and the irritating effects of urine you may expect frequency of urination and/or urgency (a stronger desire to urinate) and perhaps even getting up at night more often. This will usually resolve or improve slowly over the healing period. You may see some blood in your urine over the first 6 weeks. Do not be alarmed, even if the urine was clear for a while. Get off your feet and drink lots of fluids until clearing occurs. If you start to pass clots or don't improve call us.  Catheter: (If you are discharged with a catheter.)  1. Keep your catheter secured to your leg at all times with tape or the supplied strap. 2. You may experience leakage of urine around your catheter- as long as the  catheter continues to drain, this is normal.  If your catheter stops draining  go to the ER. 3. You may also have blood in your urine, even after it has been clear for  several days; you may even pass some small blood clots or other material.  This  is normal as well.  If this happens, sit down and drink plenty of water to help  make urine to flush out your bladder.  If the blood in your urine becomes worse  after doing this, contact our office or return to the ER. 4. You may use the leg bag (small bag) during the day, but use the large bag at  night.  Diet:  You may return to your normal diet immediately. Because of the raw surface of your bladder, alcohol, spicy foods, foods high in acid and drinks with caffeine may  cause irritation or frequency and should be used in moderation. To keep your urine flowing freely and avoid constipation, drink plenty of fluids during the day (8-10 glasses). Tip: Avoid cranberry juice because it is very acidic.  Activity:  Your physical activity doesn't need to be restricted. However, if you are very active, you may see some blood in the urine. We suggest that you reduce your activity under the circumstances until the bleeding has stopped.  Bowels:  It is important to keep your bowels regular during the postoperative period. Straining with bowel movements can cause bleeding. A bowel movement every other day is reasonable. Use a mild laxative if needed, such as milk of magnesia 2-3 tablespoons, or 2 Dulcolax tablets. Call if you continue to have problems. If you had been taking narcotics for pain, before, during or after your surgery, you may be constipated. Take a laxative if necessary.    Medication:  You should resume your pre-surgery medications unless told not to. In addition you may be given an antibiotic to prevent or treat infection. Antibiotics are not always necessary. All medication should be taken as prescribed until the bottles are finished unless you are having an unusual reaction to one of the drugs.   

## 2024-01-18 NOTE — Op Note (Signed)
 Operative Note  Preoperative diagnosis:  1.  Bladder lesion 2.  Gross hematuria  Postoperative diagnosis: 1.  Bladder lesion 2.  Gross hematuria  Procedure(s): 1.  Cystoscopy with bladder biopsy and fulguration  Surgeon: Kasandra Knudsen, MD  Assistants:  None  Anesthesia:  General  Complications:  None  EBL:  none  Specimens: 1. Bladder biopsy  Drains/Catheters: 1.  none  Intraoperative findings:   Normal anterior urethra Obstructing lateral lobes with high riding bladder neck Orthotopic Uos bilateral (close proximity to bladder neck) Prior area of concern seen on posterior bladder wall biopsied  Indication:  Cody Wade is a 58 y.o. male who initially presented with gross hematuria found to have bladder lesion on diagnostic cystoscopy.  Description of procedure: After risks and benefits discussed with patient, informed consent obtained.  The patient was taken to the operating room and placed in the supine position.  Anesthesia was induced and antibiotics were administered.  He was then repositioned in the dorsolithotomy position.  The patient was prepped and draped in the usual sterile fashion and timeout was performed.  A 21 French rigid cystoscope was placed in the urethral meatus and advanced into the bladder under direct visualization.  The previously seen area of concern was again identified.  The rigid biopsy forceps were used to take a cold cup bladder biopsy from the posterior wall.  The Bugbee was then used to fulgurate the area and any surrounding erythema.  Hemostasis adequate with the irrigant turned off.  No concern for bladder perforation at end of case.  The bladder was decompressed and the cystoscope removed.  The patient emerged from anesthesia and transferred PACU stable condition.   Plan:  Discharge home

## 2024-01-18 NOTE — H&P (Signed)
 CC/HPI: cc: Gross hematuria, BPH with LUTS   11/26/2023: 58 year old man referred for an episode of painless gross hematuria. He has no history of kidney stones, UTIs or family history of prostate/bladder/kidney cancer. No pain was associated with this. No tobacco history. He does have weak urinary stream and intermittency but is not particularly bothered by these.   IPSS: 3, 0, 4, 0, 3, 1, 1= 12/35  2/6 mostly satisfied quality of life   01/05/2024: Here for cystoscopy as part of gross hematuria evaluation     ALLERGIES: No Known Allergies    MEDICATIONS: Allegra Allergy 180 mg tablet  Atorvastatin Calcium 20 mg tablet  Azelastine-Fluticasone  Cyclobenzaprine Hcl 10 mg tablet  Epinephrine 0.3 mg/0.3 ml auto-injector  Flax Seed Oil  Flonase Allergy Relief 50 mcg/actuation spray, suspension  Gabapentin 100 mg capsule  Hydrocortisone 1 % cream  Meloxicam 15 mg tablet  Montelukast Sodium 10 mg tablet  Multivitamin  Olopatadine Hcl 0.1 % drops  Omega 3  Pantoprazole Sodium 40 mg tablet, delayed release  Sertraline Hcl 25 mg tablet     GU PSH: Locm 300-399Mg /Ml Iodine,1Ml - 12/21/2023     NON-GU PSH: Hand/finger Surgery Knee Arthroscopy     GU PMH: Gross hematuria - 12/21/2023, - 11/26/2023 BPH w/LUTS - 11/26/2023 Weak Urinary Stream - 11/26/2023    NON-GU PMH: Arrhythmia GERD Hypercholesterolemia Sleep Apnea    FAMILY HISTORY: 2 daughters - Runs in Family   SOCIAL HISTORY: Marital Status: Married Preferred Language: English; Ethnicity: Not Hispanic Or Latino; Race: Black or African American Current Smoking Status: Patient has never smoked.   Tobacco Use Assessment Completed: Used Tobacco in last 30 days? Does drink.  Drinks 1 caffeinated drink per day.    REVIEW OF SYSTEMS:    GU Review Male:   Patient denies frequent urination, hard to postpone urination, burning/ pain with urination, get up at night to urinate, leakage of urine, stream starts and stops, trouble  starting your stream, have to strain to urinate , erection problems, and penile pain.  Gastrointestinal (Upper):   Patient denies nausea, vomiting, and indigestion/ heartburn.  Gastrointestinal (Lower):   Patient denies diarrhea and constipation.  Constitutional:   Patient denies fever, night sweats, weight loss, and fatigue.  Skin:   Patient denies skin rash/ lesion and itching.  Eyes:   Patient denies blurred vision and double vision.  Ears/ Nose/ Throat:   Patient denies sore throat and sinus problems.  Hematologic/Lymphatic:   Patient denies swollen glands and easy bruising.  Cardiovascular:   Patient denies leg swelling and chest pains.  Respiratory:   Patient denies cough and shortness of breath.  Endocrine:   Patient denies excessive thirst.  Musculoskeletal:   Patient denies back pain and joint pain.  Neurological:   Patient denies headaches and dizziness.  Psychologic:   Patient denies depression and anxiety.   VITAL SIGNS: None   MULTI-SYSTEM PHYSICAL EXAMINATION:    Constitutional: Well-nourished. No physical deformities. Normally developed. Good grooming.  Neck: Neck symmetrical, not swollen. Normal tracheal position.  Respiratory: No labored breathing, no use of accessory muscles.   Skin: No paleness, no jaundice, no cyanosis. No lesion, no ulcer, no rash.  Neurologic / Psychiatric: Oriented to time, oriented to place, oriented to person. No depression, no anxiety, no agitation.  Eyes: Normal conjunctivae. Normal eyelids.  Ears, Nose, Mouth, and Throat: Left ear no scars, no lesions, no masses. Right ear no scars, no lesions, no masses. Nose no scars, no lesions, no masses. Normal  hearing. Normal lips.  Musculoskeletal: Normal gait and station of head and neck.     Complexity of Data:  Urine Test Review:   Urinalysis  X-Ray Review: C.T. Abdomen/Pelvis: Reviewed Films. Reviewed Report. Discussed With Patient. IMPRESSION:   1. No acute process or explanation for hematuria.   2. Aortic Atherosclerosis (ICD10-I70.0).    Electronically Signed  By: Jeronimo Greaves M.D.  On: 01/04/2024 16:47    PROCEDURES:         Flexible Cystoscopy - 52000  Risks, benefits, and some of the potential complications of the procedure were discussed at length with the patient including infection, bleeding, voiding discomfort, urinary retention, fever, chills, sepsis, and others. All questions were answered. Informed consent was obtained. Sterile technique and intraurethral analgesia were used.  Meatus:  Normal size. Normal location. Normal condition.  Urethra:  No strictures.  External Sphincter:  Normal.  Verumontanum:  Normal.  Prostate:  Obstructing lateral lobes and high riding bladder neck restricting movement of scope  Bladder Neck:  Non-obstructing.  Ureteral Orifices:  Normal location. Normal size. Normal shape. Effluxed clear urine.  Bladder:  Mild trabeculation. No tumors. Normal mucosa. No stones. Mucosal tissue floating on bladder which patient voided out. Circular hemorrhagic slightly raised bladder mucosa subcentimeter right posterior superior lateral wall.      The lower urinary tract was carefully examined. The procedure was well-tolerated and without complications. Antibiotic instructions were given. Instructions were given to call the office immediately for bloody urine, difficulty urinating, urinary retention, painful or frequent urination, fever, chills, nausea, vomiting or other illness. The patient stated that he understood these instructions and would comply with them.         Urinalysis Dipstick Dipstick Cont'd  Color: Yellow Bilirubin: Neg mg/dL  Appearance: Clear Ketones: Neg mg/dL  Specific Gravity: 0.981 Blood: Neg ery/uL  pH: 5.5 Protein: Neg mg/dL  Glucose: Neg mg/dL Urobilinogen: 0.2 mg/dL    Nitrites: Neg    Leukocyte Esterase: Neg leu/uL    ASSESSMENT:      ICD-10 Details  1 GU:   Gross hematuria - R31.0 Undiagnosed New Problem  2   BPH w/LUTS  - N40.1 Chronic, Stable  3   Weak Urinary Stream - R39.12 Chronic, Stable   PLAN:           Orders Labs Path Report          Document Letter(s):  Created for Patient: Clinical Summary         Notes:   1. Gross hematuria:  -Reviewed patient's CT scan independently and also gave patient printed copy of radiology report  -Cystoscopy showed an area of abnormal bladder mucosa as well as some tissue floating on the bladder. Will send the tissue off to pathology. We discussed moving forward with cystoscopy and bladder biopsy. Risks and benefits of the procedure were discussed with the patient including but not limited to pain, bleeding, infection, need for additional treatment, bladder perforation, damage to surrounding structures, change in urinary symptoms.   2. BPH with LUTS: Patient not bothered by symptoms   Schedule next available bladder biopsy

## 2024-01-18 NOTE — Transfer of Care (Signed)
 Immediate Anesthesia Transfer of Care Note  Patient: Cody Wade  Procedure(s) Performed: CYSTOSCOPY, WITH BIOPSY CYSTOSCOPY, WITH BLADDER FULGURATION  Patient Location: PACU  Anesthesia Type:General  Level of Consciousness: sedated  Airway & Oxygen Therapy: Patient Spontanous Breathing and Patient connected to face mask oxygen  Post-op Assessment: Report given to RN and Post -op Vital signs reviewed and stable  Post vital signs: Reviewed and stable  Last Vitals:  Vitals Value Taken Time  BP    Temp    Pulse 74 01/18/24 1431  Resp    SpO2 99 % 01/18/24 1431  Vitals shown include unfiled device data.  Last Pain:  Vitals:   01/18/24 1206  TempSrc: Oral         Complications: No notable events documented.

## 2024-01-18 NOTE — Anesthesia Procedure Notes (Signed)
 Procedure Name: LMA Insertion Date/Time: 01/18/2024 2:00 PM  Performed by: Doran Clay, CRNAPre-anesthesia Checklist: Patient identified, Emergency Drugs available, Suction available, Patient being monitored and Timeout performed Patient Re-evaluated:Patient Re-evaluated prior to induction Oxygen Delivery Method: Circle system utilized Preoxygenation: Pre-oxygenation with 100% oxygen Induction Type: IV induction LMA: LMA inserted LMA Size: 5.0 Tube type: Oral Number of attempts: 1 Placement Confirmation: positive ETCO2 and breath sounds checked- equal and bilateral Tube secured with: Tape Dental Injury: Teeth and Oropharynx as per pre-operative assessment

## 2024-01-18 NOTE — Anesthesia Postprocedure Evaluation (Signed)
 Anesthesia Post Note  Patient: Cody Wade  Procedure(s) Performed: CYSTOSCOPY, WITH BIOPSY CYSTOSCOPY, WITH BLADDER FULGURATION     Patient location during evaluation: PACU Anesthesia Type: General Level of consciousness: sedated and patient cooperative Pain management: pain level controlled Vital Signs Assessment: post-procedure vital signs reviewed and stable Respiratory status: spontaneous breathing Cardiovascular status: stable Anesthetic complications: no  No notable events documented.  Last Vitals:  Vitals:   01/18/24 1615 01/18/24 1630  BP: 132/84 136/81  Pulse: 70 70  Resp: 11 11  Temp:    SpO2: 97% 99%    Last Pain:  Vitals:   01/18/24 1630  TempSrc:   PainSc: 0-No pain                 Lewie Loron

## 2024-01-19 ENCOUNTER — Encounter (HOSPITAL_COMMUNITY): Payer: Self-pay | Admitting: Urology

## 2024-01-19 LAB — SURGICAL PATHOLOGY

## 2024-01-24 DIAGNOSIS — E559 Vitamin D deficiency, unspecified: Secondary | ICD-10-CM | POA: Diagnosis not present

## 2024-01-24 DIAGNOSIS — Z125 Encounter for screening for malignant neoplasm of prostate: Secondary | ICD-10-CM | POA: Diagnosis not present

## 2024-01-24 DIAGNOSIS — Z6834 Body mass index (BMI) 34.0-34.9, adult: Secondary | ICD-10-CM | POA: Diagnosis not present

## 2024-01-24 DIAGNOSIS — Z130 Encounter for screening for diseases of the blood and blood-forming organs and certain disorders involving the immune mechanism: Secondary | ICD-10-CM | POA: Diagnosis not present

## 2024-01-24 DIAGNOSIS — Z1329 Encounter for screening for other suspected endocrine disorder: Secondary | ICD-10-CM | POA: Diagnosis not present

## 2024-01-24 DIAGNOSIS — Z131 Encounter for screening for diabetes mellitus: Secondary | ICD-10-CM | POA: Diagnosis not present

## 2024-01-24 DIAGNOSIS — R635 Abnormal weight gain: Secondary | ICD-10-CM | POA: Diagnosis not present

## 2024-01-24 DIAGNOSIS — E291 Testicular hypofunction: Secondary | ICD-10-CM | POA: Diagnosis not present

## 2024-01-24 DIAGNOSIS — E785 Hyperlipidemia, unspecified: Secondary | ICD-10-CM | POA: Diagnosis not present

## 2024-01-24 DIAGNOSIS — F419 Anxiety disorder, unspecified: Secondary | ICD-10-CM | POA: Diagnosis not present

## 2024-01-26 DIAGNOSIS — R310 Gross hematuria: Secondary | ICD-10-CM | POA: Diagnosis not present

## 2024-01-26 DIAGNOSIS — R3912 Poor urinary stream: Secondary | ICD-10-CM | POA: Diagnosis not present

## 2024-01-26 DIAGNOSIS — N401 Enlarged prostate with lower urinary tract symptoms: Secondary | ICD-10-CM | POA: Diagnosis not present

## 2024-01-26 DIAGNOSIS — N3000 Acute cystitis without hematuria: Secondary | ICD-10-CM | POA: Diagnosis not present

## 2024-01-29 DIAGNOSIS — G4733 Obstructive sleep apnea (adult) (pediatric): Secondary | ICD-10-CM | POA: Diagnosis not present

## 2024-01-31 DIAGNOSIS — E669 Obesity, unspecified: Secondary | ICD-10-CM | POA: Diagnosis not present

## 2024-01-31 DIAGNOSIS — E559 Vitamin D deficiency, unspecified: Secondary | ICD-10-CM | POA: Diagnosis not present

## 2024-01-31 DIAGNOSIS — Z1331 Encounter for screening for depression: Secondary | ICD-10-CM | POA: Diagnosis not present

## 2024-01-31 DIAGNOSIS — R7303 Prediabetes: Secondary | ICD-10-CM | POA: Diagnosis not present

## 2024-01-31 DIAGNOSIS — G4730 Sleep apnea, unspecified: Secondary | ICD-10-CM | POA: Diagnosis not present

## 2024-02-11 DIAGNOSIS — E785 Hyperlipidemia, unspecified: Secondary | ICD-10-CM | POA: Diagnosis not present

## 2024-02-11 DIAGNOSIS — Z6833 Body mass index (BMI) 33.0-33.9, adult: Secondary | ICD-10-CM | POA: Diagnosis not present

## 2024-02-15 DIAGNOSIS — Z6833 Body mass index (BMI) 33.0-33.9, adult: Secondary | ICD-10-CM | POA: Diagnosis not present

## 2024-02-15 DIAGNOSIS — F419 Anxiety disorder, unspecified: Secondary | ICD-10-CM | POA: Diagnosis not present

## 2024-02-23 DIAGNOSIS — Z6834 Body mass index (BMI) 34.0-34.9, adult: Secondary | ICD-10-CM | POA: Diagnosis not present

## 2024-02-23 DIAGNOSIS — E785 Hyperlipidemia, unspecified: Secondary | ICD-10-CM | POA: Diagnosis not present

## 2024-02-29 DIAGNOSIS — G4733 Obstructive sleep apnea (adult) (pediatric): Secondary | ICD-10-CM | POA: Diagnosis not present

## 2024-03-10 DIAGNOSIS — R051 Acute cough: Secondary | ICD-10-CM | POA: Diagnosis not present

## 2024-03-10 DIAGNOSIS — R0981 Nasal congestion: Secondary | ICD-10-CM | POA: Diagnosis not present

## 2024-03-10 DIAGNOSIS — J069 Acute upper respiratory infection, unspecified: Secondary | ICD-10-CM | POA: Diagnosis not present

## 2024-03-10 DIAGNOSIS — J029 Acute pharyngitis, unspecified: Secondary | ICD-10-CM | POA: Diagnosis not present

## 2024-03-30 DIAGNOSIS — E559 Vitamin D deficiency, unspecified: Secondary | ICD-10-CM | POA: Diagnosis not present

## 2024-03-31 DIAGNOSIS — G4733 Obstructive sleep apnea (adult) (pediatric): Secondary | ICD-10-CM | POA: Diagnosis not present

## 2024-04-10 DIAGNOSIS — E7800 Pure hypercholesterolemia, unspecified: Secondary | ICD-10-CM | POA: Diagnosis not present

## 2024-04-30 DIAGNOSIS — G4733 Obstructive sleep apnea (adult) (pediatric): Secondary | ICD-10-CM | POA: Diagnosis not present

## 2024-05-03 DIAGNOSIS — E785 Hyperlipidemia, unspecified: Secondary | ICD-10-CM | POA: Diagnosis not present

## 2024-06-07 DIAGNOSIS — G4733 Obstructive sleep apnea (adult) (pediatric): Secondary | ICD-10-CM | POA: Diagnosis not present

## 2024-07-08 DIAGNOSIS — G4733 Obstructive sleep apnea (adult) (pediatric): Secondary | ICD-10-CM | POA: Diagnosis not present

## 2024-07-26 DIAGNOSIS — F411 Generalized anxiety disorder: Secondary | ICD-10-CM | POA: Diagnosis not present

## 2024-07-26 DIAGNOSIS — R7303 Prediabetes: Secondary | ICD-10-CM | POA: Diagnosis not present

## 2024-07-26 DIAGNOSIS — E669 Obesity, unspecified: Secondary | ICD-10-CM | POA: Diagnosis not present

## 2024-07-26 DIAGNOSIS — E7800 Pure hypercholesterolemia, unspecified: Secondary | ICD-10-CM | POA: Diagnosis not present

## 2024-07-26 DIAGNOSIS — G4733 Obstructive sleep apnea (adult) (pediatric): Secondary | ICD-10-CM | POA: Diagnosis not present

## 2024-07-26 DIAGNOSIS — Z23 Encounter for immunization: Secondary | ICD-10-CM | POA: Diagnosis not present

## 2024-08-07 DIAGNOSIS — G4733 Obstructive sleep apnea (adult) (pediatric): Secondary | ICD-10-CM | POA: Diagnosis not present
